# Patient Record
Sex: Female | Born: 1965
Health system: Southern US, Community
[De-identification: ages and names within clinical notes are randomized; demographics above are authoritative.]

## PROBLEM LIST (undated history)

## (undated) DIAGNOSIS — H905 Unspecified sensorineural hearing loss: Secondary | ICD-10-CM

## (undated) DIAGNOSIS — Z86718 Personal history of other venous thrombosis and embolism: Secondary | ICD-10-CM

## (undated) DIAGNOSIS — Z91030 Bee allergy status: Secondary | ICD-10-CM

## (undated) DIAGNOSIS — R002 Palpitations: Secondary | ICD-10-CM

## (undated) DIAGNOSIS — E559 Vitamin D deficiency, unspecified: Secondary | ICD-10-CM

## (undated) HISTORY — DX: Personal history of other venous thrombosis and embolism: Z86.718

## (undated) HISTORY — DX: Vitamin D deficiency, unspecified: E55.9

## (undated) HISTORY — DX: Unspecified sensorineural hearing loss: H90.5

## (undated) HISTORY — DX: Bee allergy status: Z91.030

## (undated) HISTORY — PX: TUBAL LIGATION: SHX77

## (undated) HISTORY — DX: Palpitations: R00.2

---

## 1999-06-20 ENCOUNTER — Other Ambulatory Visit: Admission: RE | Admit: 1999-06-20 | Discharge: 1999-06-20 | Payer: Self-pay | Admitting: Obstetrics and Gynecology

## 2000-07-02 ENCOUNTER — Other Ambulatory Visit: Admission: RE | Admit: 2000-07-02 | Discharge: 2000-07-02 | Payer: Self-pay | Admitting: Obstetrics and Gynecology

## 2001-07-06 ENCOUNTER — Other Ambulatory Visit: Admission: RE | Admit: 2001-07-06 | Discharge: 2001-07-06 | Payer: Self-pay | Admitting: Obstetrics and Gynecology

## 2002-01-04 ENCOUNTER — Ambulatory Visit: Admission: RE | Admit: 2002-01-04 | Discharge: 2002-01-04 | Payer: Self-pay | Admitting: Obstetrics and Gynecology

## 2002-03-16 ENCOUNTER — Ambulatory Visit (HOSPITAL_BASED_OUTPATIENT_CLINIC_OR_DEPARTMENT_OTHER): Admission: RE | Admit: 2002-03-16 | Discharge: 2002-03-16 | Payer: Self-pay | Admitting: Obstetrics and Gynecology

## 2002-07-29 ENCOUNTER — Ambulatory Visit (HOSPITAL_COMMUNITY): Admission: RE | Admit: 2002-07-29 | Discharge: 2002-07-29 | Payer: Self-pay | Admitting: *Deleted

## 2002-07-29 ENCOUNTER — Encounter: Payer: Self-pay | Admitting: *Deleted

## 2002-08-09 ENCOUNTER — Other Ambulatory Visit: Admission: RE | Admit: 2002-08-09 | Discharge: 2002-08-09 | Payer: Self-pay | Admitting: Obstetrics and Gynecology

## 2003-09-26 ENCOUNTER — Other Ambulatory Visit: Admission: RE | Admit: 2003-09-26 | Discharge: 2003-09-26 | Payer: Self-pay | Admitting: Obstetrics and Gynecology

## 2003-11-11 ENCOUNTER — Encounter: Admission: RE | Admit: 2003-11-11 | Discharge: 2003-11-11 | Payer: Self-pay | Admitting: Obstetrics and Gynecology

## 2003-11-22 ENCOUNTER — Ambulatory Visit (HOSPITAL_COMMUNITY): Admission: RE | Admit: 2003-11-22 | Discharge: 2003-11-22 | Payer: Self-pay | Admitting: Family Medicine

## 2004-02-23 ENCOUNTER — Ambulatory Visit: Payer: Self-pay | Admitting: Pulmonary Disease

## 2004-03-30 ENCOUNTER — Other Ambulatory Visit: Admission: RE | Admit: 2004-03-30 | Discharge: 2004-03-30 | Payer: Self-pay | Admitting: Obstetrics and Gynecology

## 2004-06-22 ENCOUNTER — Inpatient Hospital Stay (HOSPITAL_COMMUNITY): Admission: AD | Admit: 2004-06-22 | Discharge: 2004-06-23 | Payer: Self-pay | Admitting: *Deleted

## 2004-10-31 ENCOUNTER — Other Ambulatory Visit: Admission: RE | Admit: 2004-10-31 | Discharge: 2004-10-31 | Payer: Self-pay | Admitting: Obstetrics and Gynecology

## 2004-11-14 ENCOUNTER — Encounter: Admission: RE | Admit: 2004-11-14 | Discharge: 2004-11-14 | Payer: Self-pay | Admitting: Obstetrics and Gynecology

## 2009-11-16 ENCOUNTER — Encounter: Admission: RE | Admit: 2009-11-16 | Discharge: 2009-11-16 | Payer: Self-pay | Admitting: Obstetrics and Gynecology

## 2010-07-13 NOTE — H&P (Signed)
NAMEMarland Navarro  PLEASANT, BRITZ NO.:  [id number]   MEDICAL RECORD NO.:  [id number]          PATIENT TYPE:  INP   LOCATION:  5501                         FACILITY:  [facility]   PHYSICIAN:  Theone Stanley, MD   DATE OF BIRTH:  Jul 09, 1965   DATE OF ADMISSION:  06/22/2004  DATE OF DISCHARGE:                                HISTORY & PHYSICAL   CHIEF COMPLAINT:  Left leg pain.   HISTORY OF PRESENT ILLNESS:  Ms. Julie Navarro is a very pleasant 45 year old  Caucasian female presenting to the office secondary to left leg pain and  swelling since Tuesday. She describes the pain as a crampy type feeling  which originally was in her calf which now extends to her whole leg. She had  an appointment with Dr. Ann Lions on Monday because of pain in her foot. X-  rays were performed which showed a pin that appeared to be too long. However  the next day she started to have pain and she went to explain this to Dr.  Gaylan Gerold. Dr. Gaylan Gerold set up an appointment with [facility] to do a Doppler of her  lower extremities which showed left leg deep venous thrombosis extending  from the common femoral vein extending to the calf veins into the distal two-  thirds of the calf. This prompted Dr. Gaylan Gerold to admit the patient. The  patient states she has had no other similar problems. She has no family  history of blood clots and no blood dyscrasias. She did note that she had a  slight fever on Wednesday night.   PAST MEDICAL HISTORY:  Some question of hypoglycemia.   SURGERIES:  The patient had a left bunion surgery on April 25, 2004, and a  right bunion surgery on March 07, 2004.   MEDICATIONS:  She takes oral contraceptives (she thinks Nortrel 777).   ALLERGIES:  No known drug allergies. She is allergic to BEE STINGS.   FAMILY HISTORY:  Mother had lung cancer, she was a smoker. Father had mitral  valve prolapse and hyperlipidemia.   SOCIAL HISTORY:  The patient lives in [place], she is single. No  children. No  tobacco or illicit drug use. She occasionally drinks alcohol.   REVIEW OF SYSTEMS:  See HPI, otherwise all systems were negative.   PHYSICAL EXAMINATION:  VITAL SIGNS:  Temperature 98.6, pulse of 86,  respiratory rate of 20, blood pressure 115/74, saturating 95% on room air.  HEENT:  Head atraumatic, normocephalic. Extraocular movements are intact.  Ears are normal without discharge. Throat clear, no erythema or exudates.  NECK:  Supple, no lymphadenopathy, no jugular venous distension.  HEART:  Regular rate and rhythm, no murmurs, rubs, or gallops appreciated.  LUNGS:  Clear to auscultation bilaterally.  ABDOMEN:  Soft, nontender, nondistended.  GU:  Deferred.  EXTREMITIES:  It was evident that her left leg was slightly larger than her  right. In addition it felt somewhat warmer. She has minimal pain on  palpation of the calf and negative Homan's sign.  NEURO:  The patient is alert and oriented x3, 5 out of 5 strength in upper  and  lower extremities.   LABS:  Are pending.   ASSESSMENT/PLAN:  A 45 year old female with recent surgery in March, and on  oral contraceptives, presenting with an extensive deep venous thrombosis.  Will start Lovenox here in the hospital and start Coumadin tomorrow morning.  Give education on her Coumadin and instruct her on Lovenox injections. The  patient does have several risk factors for a deep venous thrombosis,  however, she is young and it has been quite some time since her original  surgery. Therefore will obtain a hypercoagulable panel to see if there are  any underlying issues. I instructed her that she can no longer take her oral  contraceptive pill and that she will need to see her gynecologist regarding  other medications that she can take for contraception.      AEJ/MEDQ  D:  06/22/2004  T:  06/23/2004  Job:  [id number]   cc:   Tinnie Gens A. Petrinitz, D.P.M.  [address] [place]  [place] [address]  Fax: [phone]

## 2010-09-26 ENCOUNTER — Other Ambulatory Visit: Payer: Self-pay | Admitting: Orthopaedic Surgery

## 2010-09-26 DIAGNOSIS — M25561 Pain in right knee: Secondary | ICD-10-CM

## 2010-09-28 ENCOUNTER — Ambulatory Visit
Admission: RE | Admit: 2010-09-28 | Discharge: 2010-09-28 | Disposition: A | Discharge status: Home / Self Care | Payer: BC Managed Care – PPO | Source: Ambulatory Visit | Attending: Orthopaedic Surgery | Admitting: Orthopaedic Surgery

## 2010-09-28 DIAGNOSIS — M25561 Pain in right knee: Secondary | ICD-10-CM

## 2011-04-23 ENCOUNTER — Other Ambulatory Visit: Payer: Self-pay | Admitting: Obstetrics and Gynecology

## 2011-04-23 DIAGNOSIS — Z1231 Encounter for screening mammogram for malignant neoplasm of breast: Secondary | ICD-10-CM

## 2011-04-29 ENCOUNTER — Ambulatory Visit
Admission: RE | Admit: 2011-04-29 | Discharge: 2011-04-29 | Disposition: A | Discharge status: Home / Self Care | Payer: BC Managed Care – PPO | Source: Ambulatory Visit | Attending: Obstetrics and Gynecology | Admitting: Obstetrics and Gynecology

## 2011-04-29 DIAGNOSIS — Z1231 Encounter for screening mammogram for malignant neoplasm of breast: Secondary | ICD-10-CM

## 2012-05-05 ENCOUNTER — Other Ambulatory Visit: Payer: Self-pay

## 2012-05-05 DIAGNOSIS — Z1231 Encounter for screening mammogram for malignant neoplasm of breast: Secondary | ICD-10-CM

## 2012-06-10 ENCOUNTER — Ambulatory Visit
Admission: RE | Admit: 2012-06-10 | Discharge: 2012-06-10 | Disposition: A | Discharge status: Home / Self Care | Payer: BC Managed Care – PPO | Source: Ambulatory Visit

## 2012-06-10 DIAGNOSIS — Z1231 Encounter for screening mammogram for malignant neoplasm of breast: Secondary | ICD-10-CM

## 2013-05-27 ENCOUNTER — Other Ambulatory Visit: Payer: Self-pay

## 2013-05-27 DIAGNOSIS — Z1231 Encounter for screening mammogram for malignant neoplasm of breast: Secondary | ICD-10-CM

## 2013-06-15 ENCOUNTER — Ambulatory Visit
Admission: RE | Admit: 2013-06-15 | Discharge: 2013-06-15 | Disposition: A | Discharge status: Home / Self Care | Payer: Self-pay | Source: Ambulatory Visit

## 2013-06-15 ENCOUNTER — Encounter (INDEPENDENT_AMBULATORY_CARE_PROVIDER_SITE_OTHER): Payer: Self-pay

## 2013-06-15 DIAGNOSIS — Z1231 Encounter for screening mammogram for malignant neoplasm of breast: Secondary | ICD-10-CM

## 2013-06-18 ENCOUNTER — Other Ambulatory Visit: Payer: Self-pay | Admitting: Obstetrics and Gynecology

## 2013-06-18 DIAGNOSIS — R928 Other abnormal and inconclusive findings on diagnostic imaging of breast: Secondary | ICD-10-CM

## 2013-06-30 ENCOUNTER — Ambulatory Visit
Admission: RE | Admit: 2013-06-30 | Discharge: 2013-06-30 | Disposition: A | Discharge status: Home / Self Care | Payer: BC Managed Care – PPO | Source: Ambulatory Visit | Attending: Obstetrics and Gynecology | Admitting: Obstetrics and Gynecology

## 2013-06-30 DIAGNOSIS — R928 Other abnormal and inconclusive findings on diagnostic imaging of breast: Secondary | ICD-10-CM

## 2013-11-29 ENCOUNTER — Other Ambulatory Visit: Payer: Self-pay | Admitting: Obstetrics and Gynecology

## 2013-11-29 DIAGNOSIS — N6489 Other specified disorders of breast: Secondary | ICD-10-CM

## 2013-12-31 ENCOUNTER — Other Ambulatory Visit: Payer: Self-pay | Admitting: Obstetrics and Gynecology

## 2013-12-31 ENCOUNTER — Ambulatory Visit
Admission: RE | Admit: 2013-12-31 | Discharge: 2013-12-31 | Disposition: A | Discharge status: Home / Self Care | Payer: BC Managed Care – PPO | Source: Ambulatory Visit | Attending: Obstetrics and Gynecology | Admitting: Obstetrics and Gynecology

## 2013-12-31 DIAGNOSIS — N6489 Other specified disorders of breast: Secondary | ICD-10-CM

## 2014-05-19 ENCOUNTER — Other Ambulatory Visit: Payer: Self-pay

## 2014-05-19 DIAGNOSIS — Z1231 Encounter for screening mammogram for malignant neoplasm of breast: Secondary | ICD-10-CM

## 2014-06-20 ENCOUNTER — Ambulatory Visit
Admission: RE | Admit: 2014-06-20 | Discharge: 2014-06-20 | Disposition: A | Discharge status: Home / Self Care | Payer: 59 | Source: Ambulatory Visit

## 2014-06-20 DIAGNOSIS — Z1231 Encounter for screening mammogram for malignant neoplasm of breast: Secondary | ICD-10-CM

## 2014-10-10 ENCOUNTER — Other Ambulatory Visit: Payer: Self-pay

## 2014-12-12 ENCOUNTER — Ambulatory Visit (INDEPENDENT_AMBULATORY_CARE_PROVIDER_SITE_OTHER): Payer: Self-pay | Admitting: *Deleted

## 2014-12-12 DIAGNOSIS — J309 Allergic rhinitis, unspecified: Secondary | ICD-10-CM

## 2014-12-12 MED ORDER — EPINEPHRINE 0.3 MG/0.3ML IJ SOAJ: 0.3000 mg | Freq: Once | INTRAMUSCULAR | Status: DC

## 2015-01-09 ENCOUNTER — Ambulatory Visit (INDEPENDENT_AMBULATORY_CARE_PROVIDER_SITE_OTHER): Payer: 59

## 2015-01-09 DIAGNOSIS — T63441D Toxic effect of venom of bees, accidental (unintentional), subsequent encounter: Secondary | ICD-10-CM | POA: Diagnosis not present

## 2015-02-06 ENCOUNTER — Ambulatory Visit (INDEPENDENT_AMBULATORY_CARE_PROVIDER_SITE_OTHER): Payer: 59

## 2015-02-06 DIAGNOSIS — T63441D Toxic effect of venom of bees, accidental (unintentional), subsequent encounter: Secondary | ICD-10-CM | POA: Diagnosis not present

## 2015-03-06 ENCOUNTER — Other Ambulatory Visit: Payer: Self-pay | Admitting: Family Medicine

## 2015-03-06 DIAGNOSIS — H9121 Sudden idiopathic hearing loss, right ear: Secondary | ICD-10-CM

## 2015-03-06 DIAGNOSIS — D330 Benign neoplasm of brain, supratentorial: Secondary | ICD-10-CM

## 2015-03-09 ENCOUNTER — Ambulatory Visit (INDEPENDENT_AMBULATORY_CARE_PROVIDER_SITE_OTHER): Payer: 59 | Admitting: *Deleted

## 2015-03-09 DIAGNOSIS — T63441D Toxic effect of venom of bees, accidental (unintentional), subsequent encounter: Secondary | ICD-10-CM | POA: Diagnosis not present

## 2015-03-14 ENCOUNTER — Ambulatory Visit
Admission: RE | Admit: 2015-03-14 | Discharge: 2015-03-14 | Disposition: A | Discharge status: Home / Self Care | Payer: 59 | Source: Ambulatory Visit | Attending: Family Medicine | Admitting: Family Medicine

## 2015-03-14 DIAGNOSIS — D330 Benign neoplasm of brain, supratentorial: Secondary | ICD-10-CM

## 2015-03-14 DIAGNOSIS — H9121 Sudden idiopathic hearing loss, right ear: Secondary | ICD-10-CM

## 2015-03-14 MED ORDER — GADOBENATE DIMEGLUMINE 529 MG/ML IV SOLN
13.0000 mL | Freq: Once | INTRAVENOUS | Status: AC | PRN
  Administered 2015-03-14: 13 mL via INTRAVENOUS

## 2015-03-20 ENCOUNTER — Telehealth: Payer: Self-pay | Admitting: Pediatrics

## 2015-03-20 NOTE — Telephone Encounter (Signed)
Has a question about bill received

## 2015-03-20 NOTE — Telephone Encounter (Signed)
LEFT MESSAGE THAT I WILL CALL BACK TUESDAY

## 2015-03-22 NOTE — Telephone Encounter (Signed)
PT CALLED ME BACK TODAY - QUESTIONS ABOUT WHY SHE IS GETTING 2 BILLS - SHE WILL PAY WHEN SHE COMES IN FOR INJ EARLY FEB

## 2015-04-06 ENCOUNTER — Ambulatory Visit (INDEPENDENT_AMBULATORY_CARE_PROVIDER_SITE_OTHER): Payer: 59 | Admitting: *Deleted

## 2015-04-06 DIAGNOSIS — T63441D Toxic effect of venom of bees, accidental (unintentional), subsequent encounter: Secondary | ICD-10-CM | POA: Diagnosis not present

## 2015-05-03 ENCOUNTER — Telehealth: Payer: Self-pay | Admitting: Pediatrics

## 2015-05-03 ENCOUNTER — Ambulatory Visit (INDEPENDENT_AMBULATORY_CARE_PROVIDER_SITE_OTHER): Payer: 59 | Admitting: *Deleted

## 2015-05-03 DIAGNOSIS — T63441D Toxic effect of venom of bees, accidental (unintentional), subsequent encounter: Secondary | ICD-10-CM | POA: Diagnosis not present

## 2015-05-03 NOTE — Telephone Encounter (Signed)
MAILED PAYMENTS FOR 2016

## 2015-05-03 NOTE — Telephone Encounter (Signed)
Patient request an itemized bill for all of 2016 to show the payments she has made so she can add them onto her taxes. I was able to print a bill from 02/25/2014 through 11/21/2014. Can you print the remainder of they year from [facility] and mail it to her please. Thanks!

## 2015-05-30 ENCOUNTER — Ambulatory Visit (INDEPENDENT_AMBULATORY_CARE_PROVIDER_SITE_OTHER): Payer: 59

## 2015-05-30 DIAGNOSIS — T63441D Toxic effect of venom of bees, accidental (unintentional), subsequent encounter: Secondary | ICD-10-CM

## 2015-06-05 ENCOUNTER — Other Ambulatory Visit: Payer: Self-pay

## 2015-06-05 DIAGNOSIS — Z1231 Encounter for screening mammogram for malignant neoplasm of breast: Secondary | ICD-10-CM

## 2015-06-21 ENCOUNTER — Ambulatory Visit
Admission: RE | Admit: 2015-06-21 | Discharge: 2015-06-21 | Disposition: A | Discharge status: Home / Self Care | Payer: 59 | Source: Ambulatory Visit

## 2015-06-21 DIAGNOSIS — Z1231 Encounter for screening mammogram for malignant neoplasm of breast: Secondary | ICD-10-CM

## 2015-06-27 ENCOUNTER — Ambulatory Visit (INDEPENDENT_AMBULATORY_CARE_PROVIDER_SITE_OTHER): Payer: 59

## 2015-06-27 DIAGNOSIS — T63441D Toxic effect of venom of bees, accidental (unintentional), subsequent encounter: Secondary | ICD-10-CM | POA: Diagnosis not present

## 2015-08-25 DIAGNOSIS — T63441D Toxic effect of venom of bees, accidental (unintentional), subsequent encounter: Secondary | ICD-10-CM | POA: Diagnosis not present

## 2015-08-30 DIAGNOSIS — T63441D Toxic effect of venom of bees, accidental (unintentional), subsequent encounter: Secondary | ICD-10-CM | POA: Diagnosis not present

## 2015-08-31 ENCOUNTER — Ambulatory Visit (INDEPENDENT_AMBULATORY_CARE_PROVIDER_SITE_OTHER): Payer: 59 | Admitting: *Deleted

## 2015-08-31 DIAGNOSIS — T63441D Toxic effect of venom of bees, accidental (unintentional), subsequent encounter: Secondary | ICD-10-CM

## 2015-10-10 DIAGNOSIS — T63441D Toxic effect of venom of bees, accidental (unintentional), subsequent encounter: Secondary | ICD-10-CM | POA: Diagnosis not present

## 2015-10-11 DIAGNOSIS — T63441D Toxic effect of venom of bees, accidental (unintentional), subsequent encounter: Secondary | ICD-10-CM | POA: Diagnosis not present

## 2015-10-12 ENCOUNTER — Ambulatory Visit (INDEPENDENT_AMBULATORY_CARE_PROVIDER_SITE_OTHER): Payer: 59 | Admitting: *Deleted

## 2015-10-12 DIAGNOSIS — T63441D Toxic effect of venom of bees, accidental (unintentional), subsequent encounter: Secondary | ICD-10-CM

## 2015-11-22 DIAGNOSIS — T63441D Toxic effect of venom of bees, accidental (unintentional), subsequent encounter: Secondary | ICD-10-CM | POA: Diagnosis not present

## 2015-11-23 ENCOUNTER — Ambulatory Visit (INDEPENDENT_AMBULATORY_CARE_PROVIDER_SITE_OTHER): Payer: 59

## 2015-11-23 DIAGNOSIS — T63441D Toxic effect of venom of bees, accidental (unintentional), subsequent encounter: Secondary | ICD-10-CM

## 2016-01-02 ENCOUNTER — Ambulatory Visit: Payer: 59

## 2016-01-02 ENCOUNTER — Ambulatory Visit: Payer: 59 | Admitting: Pediatrics

## 2016-01-02 ENCOUNTER — Encounter: Payer: Self-pay | Admitting: Pediatrics

## 2016-01-02 VITALS — BP 114/60 | HR 63 | Temp 98.1°F | Resp 16 | Ht 61.25 in | Wt 149.0 lb

## 2016-01-02 DIAGNOSIS — T63481D Toxic effect of venom of other arthropod, accidental (unintentional), subsequent encounter: Secondary | ICD-10-CM

## 2016-01-02 DIAGNOSIS — T63481A Toxic effect of venom of other arthropod, accidental (unintentional), initial encounter: Secondary | ICD-10-CM | POA: Insufficient documentation

## 2016-01-02 MED ORDER — EPINEPHRINE 0.3 MG/0.3ML IJ SOAJ: INTRAMUSCULAR | 2 refills | Status: DC

## 2016-01-02 NOTE — Patient Instructions (Addendum)
Continue on venom injections every 6 weeks until May 2019. We will then go to venom injections every 8 weeks. We will plan to retest you for venom allergy in 2021  If you have an insect sting take Benadryl 50 mg every 4 hours and if you have life-threatening symptoms inject with EpiPen 0.3

## 2016-01-02 NOTE — Progress Notes (Signed)
  [place] [address] Dept: [phone]  FOLLOW UP NOTE  Patient ID: Julie Navarro, female    DOB: May 04, 1965  Age: 50 y.o. MRN: [id number] Date of Office Visit: 01/02/2016  Assessment  Chief Complaint: Allergies (doing well on venom injections, yearly followup )  HPI Julie Navarro presents for follow-up of an allergy to insect venoms. Her initial reaction was in 1979 consisting oh anaphylaxis She is being treated with mixed vespid venom and wasp venom. She began to get her injections every 6 weeks in May of this year. She  not had any insect sting since her last visit in July 2014.  Current medications are Benadryl and EpiPen 0.3 mg if needed   Drug Allergies:  Allergies  Allergen Reactions  . Bee Venom     Physical Exam: BP 114/60   Pulse 63   Temp 98.1 F (36.7 C) (Oral)   Resp 16   Ht 5' 1.25" (1.556 m)   Wt 149 lb (67.6 kg)   SpO2 98%   BMI 27.92 kg/m    Physical Exam  Constitutional: She appears well-developed and well-nourished.  HENT:  Eyes normal. Ears normal. Nose normal. Pharynx normal.  Neck: Neck supple.  Cardiovascular:  S1 and S2 normal no murmurs  Pulmonary/Chest:  Clear to percussion and auscultation  Lymphadenopathy:    She has no cervical adenopathy.  Psychiatric: She has a normal mood and affect. Her behavior is normal. Judgment and thought content normal.  Vitals reviewed.   Diagnostics:  none  Assessment and Plan: 1. Allergic reaction to insect sting, accidental or unintentional, subsequent encounter     Meds ordered this encounter  Medications  . EPINEPHrine (EPIPEN 2-PAK) 0.3 mg/0.3 mL IJ SOAJ injection    Sig: Use as directed for severe allergic reactions    Dispense:  2 Device    Refill:  2    Dispense either mylan brand or mylan generic    Patient Instructions  Continue on venom injections every 6 weeks until May 2019. We will then go to venom injections every 8 weeks. We will plan to retest you  for venom allergy in 2021  If you have an insect sting take Benadryl 50 mg every 4 hours and if you have life-threatening symptoms inject with EpiPen 0.3   Return in about 1 year (around 01/01/2017).    Thank you for the opportunity to care for this patient.  Please do not hesitate to contact me with questions.  Penne Lash, M.D.  [facility] [address] [place], [place] [address] [phone]

## 2016-02-13 ENCOUNTER — Ambulatory Visit (INDEPENDENT_AMBULATORY_CARE_PROVIDER_SITE_OTHER): Payer: 59

## 2016-02-13 DIAGNOSIS — T63441D Toxic effect of venom of bees, accidental (unintentional), subsequent encounter: Secondary | ICD-10-CM | POA: Diagnosis not present

## 2016-02-26 DIAGNOSIS — I82402 Acute embolism and thrombosis of unspecified deep veins of left lower extremity: Secondary | ICD-10-CM | POA: Diagnosis not present

## 2016-02-26 DIAGNOSIS — H9191 Unspecified hearing loss, right ear: Secondary | ICD-10-CM | POA: Diagnosis not present

## 2016-02-26 DIAGNOSIS — I82412 Acute embolism and thrombosis of left femoral vein: Secondary | ICD-10-CM | POA: Diagnosis not present

## 2016-02-26 DIAGNOSIS — M7989 Other specified soft tissue disorders: Secondary | ICD-10-CM | POA: Diagnosis not present

## 2016-02-26 DIAGNOSIS — E559 Vitamin D deficiency, unspecified: Secondary | ICD-10-CM | POA: Diagnosis not present

## 2016-02-26 DIAGNOSIS — M79605 Pain in left leg: Secondary | ICD-10-CM | POA: Diagnosis not present

## 2016-02-27 DIAGNOSIS — I82412 Acute embolism and thrombosis of left femoral vein: Secondary | ICD-10-CM | POA: Diagnosis not present

## 2016-03-01 ENCOUNTER — Other Ambulatory Visit: Payer: Self-pay | Admitting: Family Medicine

## 2016-03-01 DIAGNOSIS — I82402 Acute embolism and thrombosis of unspecified deep veins of left lower extremity: Secondary | ICD-10-CM

## 2016-03-01 DIAGNOSIS — I82409 Acute embolism and thrombosis of unspecified deep veins of unspecified lower extremity: Secondary | ICD-10-CM | POA: Diagnosis not present

## 2016-03-04 ENCOUNTER — Other Ambulatory Visit: Payer: 59

## 2016-03-06 DIAGNOSIS — I82409 Acute embolism and thrombosis of unspecified deep veins of unspecified lower extremity: Secondary | ICD-10-CM | POA: Diagnosis not present

## 2016-03-08 DIAGNOSIS — I82409 Acute embolism and thrombosis of unspecified deep veins of unspecified lower extremity: Secondary | ICD-10-CM | POA: Diagnosis not present

## 2016-03-25 ENCOUNTER — Ambulatory Visit (INDEPENDENT_AMBULATORY_CARE_PROVIDER_SITE_OTHER): Payer: 59

## 2016-03-25 DIAGNOSIS — T63441D Toxic effect of venom of bees, accidental (unintentional), subsequent encounter: Secondary | ICD-10-CM

## 2016-03-26 ENCOUNTER — Ambulatory Visit: Payer: 59

## 2016-05-03 DIAGNOSIS — Z7901 Long term (current) use of anticoagulants: Secondary | ICD-10-CM | POA: Diagnosis not present

## 2016-05-03 DIAGNOSIS — I82A19 Acute embolism and thrombosis of unspecified axillary vein: Secondary | ICD-10-CM | POA: Diagnosis not present

## 2016-05-07 ENCOUNTER — Ambulatory Visit (INDEPENDENT_AMBULATORY_CARE_PROVIDER_SITE_OTHER): Payer: 59

## 2016-05-07 DIAGNOSIS — T63441D Toxic effect of venom of bees, accidental (unintentional), subsequent encounter: Secondary | ICD-10-CM | POA: Diagnosis not present

## 2016-05-15 DIAGNOSIS — I82409 Acute embolism and thrombosis of unspecified deep veins of unspecified lower extremity: Secondary | ICD-10-CM | POA: Diagnosis not present

## 2016-05-31 ENCOUNTER — Other Ambulatory Visit: Payer: Self-pay | Admitting: Obstetrics and Gynecology

## 2016-05-31 DIAGNOSIS — Z1231 Encounter for screening mammogram for malignant neoplasm of breast: Secondary | ICD-10-CM

## 2016-06-18 ENCOUNTER — Ambulatory Visit (INDEPENDENT_AMBULATORY_CARE_PROVIDER_SITE_OTHER): Payer: 59 | Admitting: *Deleted

## 2016-06-18 DIAGNOSIS — T63441D Toxic effect of venom of bees, accidental (unintentional), subsequent encounter: Secondary | ICD-10-CM

## 2016-06-24 ENCOUNTER — Ambulatory Visit
Admission: RE | Admit: 2016-06-24 | Discharge: 2016-06-24 | Disposition: A | Discharge status: Home / Self Care | Payer: 59 | Source: Ambulatory Visit | Attending: Obstetrics and Gynecology | Admitting: Obstetrics and Gynecology

## 2016-06-24 DIAGNOSIS — Z1231 Encounter for screening mammogram for malignant neoplasm of breast: Secondary | ICD-10-CM

## 2016-07-19 ENCOUNTER — Other Ambulatory Visit: Payer: Self-pay | Admitting: Pediatrics

## 2016-07-19 MED ORDER — EPINEPHRINE 0.3 MG/0.3ML IJ SOAJ
INTRAMUSCULAR | 1 refills | Status: DC
Start: 2016-07-19 — End: 2018-07-02

## 2016-07-19 NOTE — Telephone Encounter (Signed)
Spoke with pt. Advised her to switch pharmacy for epi-pen refill. Epipen sent to [facility] on [address].

## 2016-07-19 NOTE — Telephone Encounter (Signed)
patient need a refill on EPI_PEN called into [facility] on [address]

## 2016-07-30 ENCOUNTER — Ambulatory Visit: Payer: Self-pay

## 2016-07-30 DIAGNOSIS — D6861 Antiphospholipid syndrome: Secondary | ICD-10-CM | POA: Diagnosis not present

## 2016-07-30 DIAGNOSIS — Z7901 Long term (current) use of anticoagulants: Secondary | ICD-10-CM | POA: Diagnosis not present

## 2016-07-30 DIAGNOSIS — I8290 Acute embolism and thrombosis of unspecified vein: Secondary | ICD-10-CM | POA: Diagnosis not present

## 2016-07-30 DIAGNOSIS — I82A19 Acute embolism and thrombosis of unspecified axillary vein: Secondary | ICD-10-CM | POA: Diagnosis not present

## 2016-07-31 ENCOUNTER — Ambulatory Visit (INDEPENDENT_AMBULATORY_CARE_PROVIDER_SITE_OTHER): Payer: 59

## 2016-07-31 DIAGNOSIS — T63441D Toxic effect of venom of bees, accidental (unintentional), subsequent encounter: Secondary | ICD-10-CM | POA: Diagnosis not present

## 2016-09-10 ENCOUNTER — Ambulatory Visit (INDEPENDENT_AMBULATORY_CARE_PROVIDER_SITE_OTHER): Payer: 59

## 2016-09-10 DIAGNOSIS — T63441D Toxic effect of venom of bees, accidental (unintentional), subsequent encounter: Secondary | ICD-10-CM

## 2016-09-10 DIAGNOSIS — I8290 Acute embolism and thrombosis of unspecified vein: Secondary | ICD-10-CM | POA: Diagnosis not present

## 2016-09-11 ENCOUNTER — Ambulatory Visit: Payer: Self-pay

## 2016-10-22 ENCOUNTER — Ambulatory Visit (INDEPENDENT_AMBULATORY_CARE_PROVIDER_SITE_OTHER): Payer: 59

## 2016-10-22 DIAGNOSIS — T63441D Toxic effect of venom of bees, accidental (unintentional), subsequent encounter: Secondary | ICD-10-CM | POA: Diagnosis not present

## 2016-12-03 ENCOUNTER — Ambulatory Visit (INDEPENDENT_AMBULATORY_CARE_PROVIDER_SITE_OTHER): Payer: 59

## 2016-12-03 ENCOUNTER — Ambulatory Visit: Payer: Self-pay

## 2016-12-03 DIAGNOSIS — T63441D Toxic effect of venom of bees, accidental (unintentional), subsequent encounter: Secondary | ICD-10-CM

## 2016-12-11 DIAGNOSIS — Z01419 Encounter for gynecological examination (general) (routine) without abnormal findings: Secondary | ICD-10-CM | POA: Diagnosis not present

## 2017-01-20 ENCOUNTER — Ambulatory Visit: Payer: Self-pay

## 2017-01-21 ENCOUNTER — Ambulatory Visit: Payer: 59

## 2017-01-23 ENCOUNTER — Ambulatory Visit (INDEPENDENT_AMBULATORY_CARE_PROVIDER_SITE_OTHER): Payer: 59

## 2017-01-23 DIAGNOSIS — T63441D Toxic effect of venom of bees, accidental (unintentional), subsequent encounter: Secondary | ICD-10-CM | POA: Diagnosis not present

## 2017-01-29 DIAGNOSIS — N946 Dysmenorrhea, unspecified: Secondary | ICD-10-CM | POA: Diagnosis not present

## 2017-01-29 DIAGNOSIS — N924 Excessive bleeding in the premenopausal period: Secondary | ICD-10-CM | POA: Diagnosis not present

## 2017-01-29 DIAGNOSIS — R1909 Other intra-abdominal and pelvic swelling, mass and lump: Secondary | ICD-10-CM | POA: Diagnosis not present

## 2017-02-05 DIAGNOSIS — N920 Excessive and frequent menstruation with regular cycle: Secondary | ICD-10-CM | POA: Diagnosis not present

## 2017-02-11 DIAGNOSIS — N920 Excessive and frequent menstruation with regular cycle: Secondary | ICD-10-CM | POA: Diagnosis not present

## 2017-02-11 DIAGNOSIS — N840 Polyp of corpus uteri: Secondary | ICD-10-CM | POA: Diagnosis not present

## 2017-03-03 ENCOUNTER — Ambulatory Visit (INDEPENDENT_AMBULATORY_CARE_PROVIDER_SITE_OTHER): Payer: 59

## 2017-03-03 DIAGNOSIS — T63441D Toxic effect of venom of bees, accidental (unintentional), subsequent encounter: Secondary | ICD-10-CM

## 2017-03-06 ENCOUNTER — Ambulatory Visit: Payer: Self-pay

## 2017-04-14 ENCOUNTER — Ambulatory Visit (INDEPENDENT_AMBULATORY_CARE_PROVIDER_SITE_OTHER): Payer: 59

## 2017-04-14 DIAGNOSIS — T63441D Toxic effect of venom of bees, accidental (unintentional), subsequent encounter: Secondary | ICD-10-CM | POA: Diagnosis not present

## 2017-05-28 ENCOUNTER — Ambulatory Visit (INDEPENDENT_AMBULATORY_CARE_PROVIDER_SITE_OTHER): Payer: 59

## 2017-05-28 ENCOUNTER — Ambulatory Visit: Payer: Self-pay

## 2017-05-28 DIAGNOSIS — T63441D Toxic effect of venom of bees, accidental (unintentional), subsequent encounter: Secondary | ICD-10-CM | POA: Diagnosis not present

## 2017-07-08 ENCOUNTER — Ambulatory Visit: Payer: Self-pay

## 2017-07-10 ENCOUNTER — Ambulatory Visit (INDEPENDENT_AMBULATORY_CARE_PROVIDER_SITE_OTHER): Payer: 59

## 2017-07-10 DIAGNOSIS — T63441D Toxic effect of venom of bees, accidental (unintentional), subsequent encounter: Secondary | ICD-10-CM | POA: Diagnosis not present

## 2017-07-15 ENCOUNTER — Other Ambulatory Visit: Payer: Self-pay | Admitting: Obstetrics and Gynecology

## 2017-07-15 DIAGNOSIS — Z1231 Encounter for screening mammogram for malignant neoplasm of breast: Secondary | ICD-10-CM

## 2017-08-07 ENCOUNTER — Ambulatory Visit
Admission: RE | Admit: 2017-08-07 | Discharge: 2017-08-07 | Disposition: A | Discharge status: Home / Self Care | Payer: 59 | Source: Ambulatory Visit | Attending: Obstetrics and Gynecology | Admitting: Obstetrics and Gynecology

## 2017-08-07 DIAGNOSIS — Z1231 Encounter for screening mammogram for malignant neoplasm of breast: Secondary | ICD-10-CM

## 2017-08-12 DIAGNOSIS — N3001 Acute cystitis with hematuria: Secondary | ICD-10-CM | POA: Diagnosis not present

## 2017-08-12 DIAGNOSIS — R350 Frequency of micturition: Secondary | ICD-10-CM | POA: Diagnosis not present

## 2017-08-20 ENCOUNTER — Telehealth: Payer: Self-pay

## 2017-08-20 ENCOUNTER — Ambulatory Visit (INDEPENDENT_AMBULATORY_CARE_PROVIDER_SITE_OTHER): Payer: 59 | Admitting: *Deleted

## 2017-08-20 DIAGNOSIS — T63441D Toxic effect of venom of bees, accidental (unintentional), subsequent encounter: Secondary | ICD-10-CM

## 2017-08-20 NOTE — Telephone Encounter (Signed)
Pt would like to know when she should get tested again for the venom? She is on MV and Wasp. Please advise

## 2017-08-20 NOTE — Telephone Encounter (Signed)
Lm for pt to call us back  

## 2017-08-20 NOTE — Telephone Encounter (Signed)
Informed pt of what dr B stated. Pt also wanted to know when she could go q 8 weeks. I went back a year ago and there was a note that stated she could go q 8 weeks in may 2019. I rescheduled pts next injections to 8 weeks.

## 2017-08-20 NOTE — Telephone Encounter (Signed)
3 to 5 years after she has reached maintenance every 4 weeks

## 2017-08-21 ENCOUNTER — Ambulatory Visit: Payer: Self-pay

## 2017-08-25 DIAGNOSIS — N390 Urinary tract infection, site not specified: Secondary | ICD-10-CM | POA: Diagnosis not present

## 2017-08-25 DIAGNOSIS — A499 Bacterial infection, unspecified: Secondary | ICD-10-CM | POA: Diagnosis not present

## 2017-08-25 DIAGNOSIS — R300 Dysuria: Secondary | ICD-10-CM | POA: Diagnosis not present

## 2017-08-27 DIAGNOSIS — D2261 Melanocytic nevi of right upper limb, including shoulder: Secondary | ICD-10-CM | POA: Diagnosis not present

## 2017-08-27 DIAGNOSIS — D2262 Melanocytic nevi of left upper limb, including shoulder: Secondary | ICD-10-CM | POA: Diagnosis not present

## 2017-08-27 DIAGNOSIS — L814 Other melanin hyperpigmentation: Secondary | ICD-10-CM | POA: Diagnosis not present

## 2017-10-01 ENCOUNTER — Ambulatory Visit: Payer: Self-pay

## 2017-10-01 DIAGNOSIS — R300 Dysuria: Secondary | ICD-10-CM | POA: Diagnosis not present

## 2017-10-01 DIAGNOSIS — R82998 Other abnormal findings in urine: Secondary | ICD-10-CM | POA: Diagnosis not present

## 2017-10-01 DIAGNOSIS — N390 Urinary tract infection, site not specified: Secondary | ICD-10-CM | POA: Diagnosis not present

## 2017-10-06 DIAGNOSIS — N390 Urinary tract infection, site not specified: Secondary | ICD-10-CM | POA: Diagnosis not present

## 2017-10-15 ENCOUNTER — Ambulatory Visit (INDEPENDENT_AMBULATORY_CARE_PROVIDER_SITE_OTHER): Payer: 59 | Admitting: *Deleted

## 2017-10-15 DIAGNOSIS — T63441D Toxic effect of venom of bees, accidental (unintentional), subsequent encounter: Secondary | ICD-10-CM

## 2017-11-20 DIAGNOSIS — Z86718 Personal history of other venous thrombosis and embolism: Secondary | ICD-10-CM | POA: Diagnosis not present

## 2017-11-20 DIAGNOSIS — Z1211 Encounter for screening for malignant neoplasm of colon: Secondary | ICD-10-CM | POA: Diagnosis not present

## 2017-11-20 DIAGNOSIS — Z91030 Bee allergy status: Secondary | ICD-10-CM | POA: Diagnosis not present

## 2017-11-25 DIAGNOSIS — R319 Hematuria, unspecified: Secondary | ICD-10-CM | POA: Diagnosis not present

## 2017-12-09 ENCOUNTER — Ambulatory Visit (INDEPENDENT_AMBULATORY_CARE_PROVIDER_SITE_OTHER): Payer: 59

## 2017-12-09 DIAGNOSIS — T63441D Toxic effect of venom of bees, accidental (unintentional), subsequent encounter: Secondary | ICD-10-CM

## 2017-12-16 DIAGNOSIS — D125 Benign neoplasm of sigmoid colon: Secondary | ICD-10-CM | POA: Diagnosis not present

## 2017-12-16 DIAGNOSIS — D122 Benign neoplasm of ascending colon: Secondary | ICD-10-CM | POA: Diagnosis not present

## 2017-12-16 DIAGNOSIS — D124 Benign neoplasm of descending colon: Secondary | ICD-10-CM | POA: Diagnosis not present

## 2017-12-16 DIAGNOSIS — D126 Benign neoplasm of colon, unspecified: Secondary | ICD-10-CM | POA: Diagnosis not present

## 2017-12-16 DIAGNOSIS — K635 Polyp of colon: Secondary | ICD-10-CM | POA: Diagnosis not present

## 2017-12-16 DIAGNOSIS — Z86010 Personal history of colonic polyps: Secondary | ICD-10-CM | POA: Diagnosis not present

## 2017-12-16 DIAGNOSIS — Z800 Family history of malignant neoplasm of digestive organs: Secondary | ICD-10-CM | POA: Diagnosis not present

## 2017-12-16 DIAGNOSIS — Z1211 Encounter for screening for malignant neoplasm of colon: Secondary | ICD-10-CM | POA: Diagnosis not present

## 2017-12-23 DIAGNOSIS — Z01419 Encounter for gynecological examination (general) (routine) without abnormal findings: Secondary | ICD-10-CM | POA: Diagnosis not present

## 2017-12-23 DIAGNOSIS — Z6827 Body mass index (BMI) 27.0-27.9, adult: Secondary | ICD-10-CM | POA: Diagnosis not present

## 2018-01-02 ENCOUNTER — Emergency Department (HOSPITAL_COMMUNITY)
Admission: EM | Admit: 2018-01-02 | Discharge: 2018-01-02 | Disposition: A | Discharge status: Home / Self Care | Payer: 59 | Attending: Emergency Medicine | Admitting: Emergency Medicine

## 2018-01-02 ENCOUNTER — Encounter (HOSPITAL_COMMUNITY): Payer: Self-pay | Admitting: Student

## 2018-01-02 DIAGNOSIS — Z7901 Long term (current) use of anticoagulants: Secondary | ICD-10-CM | POA: Insufficient documentation

## 2018-01-02 DIAGNOSIS — M7989 Other specified soft tissue disorders: Secondary | ICD-10-CM | POA: Diagnosis not present

## 2018-01-02 DIAGNOSIS — Z79899 Other long term (current) drug therapy: Secondary | ICD-10-CM | POA: Diagnosis not present

## 2018-01-02 DIAGNOSIS — R2242 Localized swelling, mass and lump, left lower limb: Secondary | ICD-10-CM | POA: Diagnosis not present

## 2018-01-02 NOTE — ED Notes (Signed)
Patient ambulatory to bathroom with steady gait at this time 

## 2018-01-02 NOTE — Discharge Instructions (Addendum)
You were seen in the ER Today for a swollen area to your left foot. We are not absolutely sure what this area is, but think it may be a portion of a varicose vein or a ganglion cyst- please see attached hand outs. Try not to irritate the area. Please have this re-evaluated by your primary care provider within 1 week. Return to the ER should the area become red/warm/hot/painful, should you develop leg swelling or calf pain consistent with previous DVTs, or any other concerns that you may have.  Additionally please have your blood pressure rechecked at your follow up appointment with primary care as this was elevated in the ER today.  Vitals:   01/02/18 0820  BP: (!) 144/99  Pulse: 66  Resp: 16  SpO2: 100%

## 2018-01-02 NOTE — ED Provider Notes (Signed)
[facility]ERGENCY DEPARTMENT Provider Note   CSN: [id number] Arrival date & time: 01/02/18  3825     History   Chief Complaint Chief Complaint  Patient presents with  . Foot Swelling    HPI Julie Navarro is a 52 y.o. female with a hx of prior left lower extremity DVTs x 2- currently on Eliquis PRN w/ flights and long travel who presents to the ED with concern for area of swelling to the L foot which she noted upon waiting up this AM. She states she went to bed without noticing anything and woke up with small circular mass/area of swelling to the anterior L foot. She states it is not painful. There are no specific alleviating/aggravating factors. Did not intervene prior to arrival. She states the affected area is in the location of a previously damaged vein which concerned her especially with her hx of DVTs. She states this does not feel similar to prior DVTs. She was seen by hematology and was not found to have a specific clotting disorder. She denies injury or change in activity. Denies pain. Denies redness, warmth, tingling, numbness, or weakness. Denies chest pain or dyspnea.   HPI  History reviewed. No pertinent past medical history.  Patient Active Problem List   Diagnosis Date Noted  . Allergic reaction to insect sting 01/02/2016    Past Surgical History:  Procedure Laterality Date  . TUBAL LIGATION       OB History   None      Home Medications    Prior to Admission medications   Medication Sig Start Date End Date Taking? Authorizing Provider  EPINEPHrine (EPIPEN 2-PAK) 0.3 mg/0.3 mL IJ SOAJ injection Use as directed for severe allergic reactions 07/19/16   Charlies Silvers, MD  NONFORMULARY OR COMPOUNDED ITEM Venom injections    [provider]  Vitamin D, Ergocalciferol, (DRISDOL) 50000 units CAPS capsule Take 50,000 Units by mouth every 7 (seven) days.    [provider]    Family History History reviewed. No  pertinent family history.  Social History Social History   Tobacco Use  . Smoking status: Never Smoker  . Smokeless tobacco: Never Used  Substance Use Topics  . Alcohol use: No  . Drug use: No     Allergies   Bee venom   Review of Systems Review of Systems  Constitutional: Negative for chills and fever.  Respiratory: Negative for shortness of breath.   Cardiovascular: Negative for chest pain.  Musculoskeletal: Negative for arthralgias and myalgias.  Skin: Negative for color change and rash.       Positive for swollen mass to L foot.   Neurological: Negative for weakness and numbness.  All other systems reviewed and are negative.    Physical Exam Updated Vital Signs BP (!) 144/99   Pulse 66   Resp 16   Ht 5\' 1"  (1.549 m)   Wt 63.5 kg   SpO2 100%   BMI 26.45 kg/m  Temp: 98.7 F orally.   Physical Exam  Constitutional: She appears well-developed and well-nourished. No distress.  HENT:  Head: Normocephalic and atraumatic.  Eyes: Conjunctivae are normal. Right eye exhibits no discharge. Left eye exhibits no discharge.  Cardiovascular: Normal rate and regular rhythm.  Pulses:      Dorsalis pedis pulses are 2+ on the right side, and 2+ on the left side.       Posterior tibial pulses are 2+ on the right side, and 2+ on the left  side.  Pulmonary/Chest: Effort normal and breath sounds normal. No respiratory distress.  Musculoskeletal:  Lower extremities: there is an approximately 1cm mobile mass to the proximal aspect of the dorsum of the left foot. This occurs fairly centrally along a varicose vein. It is not tender to palpation. No significant overlying erythema/warmth. No palpable fluctuance/induration. No other obvious deformities. No edema noted to the lower extremities. Normal AROM to the knees, ankles, and all digits. Lower extremities are non tender with soft compartments.   Neurological: She is alert.  Clear speech. Sensation grossly intact to bilateral lower  extremities. 5/5 symmetric strength with plantar/dorsiflexion. Ambulatory with steady gait.   Psychiatric: She has a normal mood and affect. Her behavior is normal. Thought content normal.  Nursing note and vitals reviewed.   ED Treatments / Results  Labs (all labs ordered are listed, but only abnormal results are displayed) Labs Reviewed - No data to display  EKG None  Radiology No results found.  Procedures Procedures (including critical care time)  Medications Ordered in ED Medications - No data to display   Initial Impression / Assessment and Plan / ED Course  I have reviewed the triage vital signs and the nursing notes.  Pertinent labs & imaging results that were available during my care of the patient were reviewed by me and considered in my medical decision making (see chart for details).   Patient presents to the ED with concern for mass to the L foot. She is concerned for possible clot as she has a damaged vein in this area and has a hx of DVTs however this is not similar per her report. Exam does not appear consistent with DVT, no pitting edema, no tenderness, area of concern is very well localized to a 1 cm mobile mass that seems suspicious for a ganglion cyst vs. varicosity based on exam and location. There is no fluctuance/induration to raise concern for abscess/cellulitis. No fevers or overlying erythema/warmth to raise concern for infectious etiology in general. While I do not suspect DVT, offered venous duplex to patient for re-assurance which she declined after our discussion. Feel patient is safe for discharge home without need for additional emergent evaluation. I discussed impression of the area, need for PCP follow-up, and return precautions with the patient. Provided opportunity for questions, patient confirmed understanding and is in agreement with plan.   Findings and plan of care discussed with supervising physician Dr. Tomi Bamberger who evaluated patient and is is in  agreement.   Final Clinical Impressions(s) / ED Diagnoses   Final diagnoses:  Mass of left foot    ED Discharge Orders    None       Amaryllis Dyke, PA-C 01/02/18 0930    Dorie Rank, MD 01/03/18 [phone]

## 2018-01-02 NOTE — ED Notes (Signed)
Patient verbalizes understanding of discharge instructions. Opportunity for questioning and answers were provided. Armband removed by staff, pt discharged from ED.  

## 2018-01-02 NOTE — ED Triage Notes (Signed)
Pt here for evaluation of left foot for possible blood clot. Hx of same. Pt noticed swelling in foot this morning and came to ED. Denies pain in foot. Pt reports taking eloquis 5mg  during long periods of travel.

## 2018-01-02 NOTE — ED Notes (Signed)
ED Provider at bedside. 

## 2018-01-07 DIAGNOSIS — M799 Soft tissue disorder, unspecified: Secondary | ICD-10-CM | POA: Diagnosis not present

## 2018-01-28 DIAGNOSIS — M799 Soft tissue disorder, unspecified: Secondary | ICD-10-CM | POA: Diagnosis not present

## 2018-02-03 ENCOUNTER — Ambulatory Visit (INDEPENDENT_AMBULATORY_CARE_PROVIDER_SITE_OTHER): Payer: 59

## 2018-02-03 DIAGNOSIS — T63441D Toxic effect of venom of bees, accidental (unintentional), subsequent encounter: Secondary | ICD-10-CM

## 2018-03-31 ENCOUNTER — Ambulatory Visit: Payer: Self-pay

## 2018-04-02 ENCOUNTER — Ambulatory Visit: Payer: Self-pay

## 2018-04-13 ENCOUNTER — Ambulatory Visit (INDEPENDENT_AMBULATORY_CARE_PROVIDER_SITE_OTHER): Payer: 59

## 2018-04-13 DIAGNOSIS — T63441D Toxic effect of venom of bees, accidental (unintentional), subsequent encounter: Secondary | ICD-10-CM | POA: Diagnosis not present

## 2018-06-08 ENCOUNTER — Ambulatory Visit: Payer: Self-pay

## 2018-06-22 DIAGNOSIS — S92909A Unspecified fracture of unspecified foot, initial encounter for closed fracture: Secondary | ICD-10-CM

## 2018-06-22 HISTORY — DX: Unspecified fracture of unspecified foot, initial encounter for closed fracture: S92.909A

## 2018-06-26 ENCOUNTER — Ambulatory Visit
Admission: RE | Admit: 2018-06-26 | Discharge: 2018-06-26 | Disposition: A | Discharge status: Home / Self Care | Payer: 59 | Source: Ambulatory Visit | Attending: Family Medicine | Admitting: Family Medicine

## 2018-06-26 ENCOUNTER — Other Ambulatory Visit: Payer: Self-pay

## 2018-06-26 ENCOUNTER — Other Ambulatory Visit: Payer: Self-pay | Admitting: Family Medicine

## 2018-06-26 DIAGNOSIS — M79672 Pain in left foot: Secondary | ICD-10-CM

## 2018-07-02 ENCOUNTER — Other Ambulatory Visit: Payer: Self-pay | Admitting: *Deleted

## 2018-07-02 MED ORDER — EPINEPHRINE 0.3 MG/0.3ML IJ SOAJ: INTRAMUSCULAR | 0 refills | Status: DC

## 2018-07-02 NOTE — Telephone Encounter (Signed)
Refilled epipen- pt is scheduling ov

## 2018-07-06 ENCOUNTER — Encounter: Payer: Self-pay | Admitting: Family Medicine

## 2018-07-06 ENCOUNTER — Ambulatory Visit (INDEPENDENT_AMBULATORY_CARE_PROVIDER_SITE_OTHER): Payer: 59 | Admitting: Family Medicine

## 2018-07-06 ENCOUNTER — Other Ambulatory Visit: Payer: Self-pay

## 2018-07-06 DIAGNOSIS — T63441A Toxic effect of venom of bees, accidental (unintentional), initial encounter: Secondary | ICD-10-CM | POA: Insufficient documentation

## 2018-07-06 DIAGNOSIS — T63441D Toxic effect of venom of bees, accidental (unintentional), subsequent encounter: Secondary | ICD-10-CM

## 2018-07-06 MED ORDER — EPINEPHRINE 0.3 MG/0.3ML IJ SOAJ: 0.3000 mg | INTRAMUSCULAR | 2 refills | Status: DC | PRN

## 2018-07-06 NOTE — Progress Notes (Addendum)
RE: Julie Navarro MRN: [id number] DOB: [birth year] Date of Telemedicine Visit: 07/06/2018  Referring provider: Fanny Bien, MD Primary care provider: Fanny Bien, MD  Chief Complaint: Allergies   Telemedicine Follow Up Visit via Telephone: I connected with Julie Navarro for a follow up on 07/06/18 by telephone and verified that I am speaking with the correct person using two identifiers.   I discussed the limitations, risks, security and privacy concerns of performing an evaluation and management service by telephone and the availability of in person appointments. I also discussed with the patient that there may be a patient responsible charge related to this service. The patient expressed understanding and agreed to proceed.  Patient is at home  Provider is at the office.  Visit start time: 10:26 Visit end time: 10:47 Insurance consent/check in by: Jan Fireman Medical consent and medical assistant/nurse: Katherina Right  History of Present Illness: She is a 53 y.o. female, who is being followed for allergy to stinging insects. Her previous allergy office visit was on 01/02/2016 with Dr. Shaune Leeks. At today's visit, she reports that she has not been stung be any stinging insects and has access to an epinephrine device at all times, however, these have expired at this time. She reports that her venom immunotherapy is going well with her last injection on 04/13/2018. She reports she did not want to risk being infected with COVID19 and stayed at home which resulted in missing her injection in March. She reports that she will be ready to resume injections in June. Her current medications are listed in the chart.  Assessment and Plan: Toxic effect of venom of bees Continue to avoid stinging insects. In case of an allergic reaction, take Benadryl 50 mg every 4 hours, and if life-threatening symptoms occur, inject with AuviQ 0.3 mg. Your next injection is scheduled for 07/30/2018   Call the clinic if this treatment plan is not working well for you  Follow up in 1 year or sooner if needed  Return in about 1 year (around 07/06/2019), or if symptoms worsen or fail to improve.  Meds ordered this encounter  Medications  . EPINEPHrine (AUVI-Q) 0.3 mg/0.3 mL IJ SOAJ injection    Sig: Inject 0.3 mLs (0.3 mg total) into the muscle as needed for anaphylaxis.    Dispense:  2 Device    Refill:  2    Medication List:  Current Outpatient Medications  Medication Sig Dispense Refill  . apixaban (ELIQUIS) 5 MG TABS tablet Take one tablet (5mg ) 1-2 hrs prior to flights >6hrs.    . NONFORMULARY OR COMPOUNDED ITEM Venom injections    . EPINEPHrine (AUVI-Q) 0.3 mg/0.3 mL IJ SOAJ injection Inject 0.3 mLs (0.3 mg total) into the muscle as needed for anaphylaxis. 2 Device 2   No current facility-administered medications for this visit.    Allergies: Allergies  Allergen Reactions  . Bee Venom    I reviewed her past medical history, social history, family history, and environmental history and no significant changes have been reported from previous visit on 01/02/2016.  Objective: Physical Exam Not obtained as encounter was done via telephone.   Previous notes and tests were reviewed.  I discussed the assessment and treatment plan with the patient. The patient was provided an opportunity to ask questions and all were answered. The patient agreed with the plan and demonstrated an understanding of the instructions.   The patient was advised to call back or seek an in-person evaluation if the symptoms worsen  or if the condition fails to improve as anticipated.  I provided 21 minutes of non-face-to-face time during this encounter.  It was my pleasure to participate in [place] Hollars's care today. Please feel free to contact me with any questions or concerns.   Sincerely,  Gareth Morgan, FNP   I have provided oversight concerning Gareth Morgan' evaluation and treatment of this  patient's health issues addressed during today's encounter. I agree with the assessment and therapeutic plan as outlined in the note.   Thank you for the opportunity to care for this patient.  Please do not hesitate to contact me with questions.  Penne Lash, M.D.  [facility] [address] [place], [place] [address] [phone]

## 2018-07-06 NOTE — Patient Instructions (Addendum)
Toxic effect of venom of bees Continue to avoid stinging insects. In case of an allergic reaction, take Benadryl 50 mg every 4 hours, and if life-threatening symptoms occur, inject with AuviQ 0.3 mg. Your next injection is scheduled for 07/30/2018  Call the clinic if this treatment plan is not working well for you  Follow up in 1 year or sooner if needed

## 2018-07-09 ENCOUNTER — Ambulatory Visit: Payer: Self-pay

## 2018-07-30 ENCOUNTER — Other Ambulatory Visit: Payer: Self-pay

## 2018-07-30 ENCOUNTER — Ambulatory Visit: Payer: 59

## 2018-07-30 DIAGNOSIS — T63441D Toxic effect of venom of bees, accidental (unintentional), subsequent encounter: Secondary | ICD-10-CM

## 2018-08-03 ENCOUNTER — Telehealth: Payer: Self-pay

## 2018-08-03 NOTE — Telephone Encounter (Signed)
Pt called stating she had been stung by a bee today and wanted to know if she should use her epi-pen even though she isn't having a reaction. I told her not to use the epi-pen just watch the area and put hydrocortisone cream on it and she can take her antihistamine medication. I informed her to use the epi-pen if she starts to have any anaphylactic reaction. I also informed her to stay in touch with [place] throughout the day to keep [place] informed of her condition.

## 2018-08-04 ENCOUNTER — Ambulatory Visit: Payer: Self-pay

## 2018-08-10 ENCOUNTER — Other Ambulatory Visit: Payer: Self-pay

## 2018-08-10 ENCOUNTER — Ambulatory Visit (INDEPENDENT_AMBULATORY_CARE_PROVIDER_SITE_OTHER): Payer: 59

## 2018-08-10 DIAGNOSIS — T63441D Toxic effect of venom of bees, accidental (unintentional), subsequent encounter: Secondary | ICD-10-CM | POA: Diagnosis not present

## 2018-09-21 ENCOUNTER — Other Ambulatory Visit: Payer: Self-pay | Admitting: Obstetrics and Gynecology

## 2018-09-21 DIAGNOSIS — Z1231 Encounter for screening mammogram for malignant neoplasm of breast: Secondary | ICD-10-CM

## 2018-10-05 ENCOUNTER — Ambulatory Visit: Payer: Self-pay

## 2018-10-06 ENCOUNTER — Ambulatory Visit: Payer: Self-pay

## 2018-10-13 ENCOUNTER — Ambulatory Visit (INDEPENDENT_AMBULATORY_CARE_PROVIDER_SITE_OTHER): Payer: 59

## 2018-10-13 DIAGNOSIS — T63441D Toxic effect of venom of bees, accidental (unintentional), subsequent encounter: Secondary | ICD-10-CM

## 2018-11-05 ENCOUNTER — Ambulatory Visit
Admission: RE | Admit: 2018-11-05 | Discharge: 2018-11-05 | Disposition: A | Discharge status: Home / Self Care | Payer: 59 | Source: Ambulatory Visit | Attending: Obstetrics and Gynecology | Admitting: Obstetrics and Gynecology

## 2018-11-05 ENCOUNTER — Other Ambulatory Visit: Payer: Self-pay

## 2018-11-05 DIAGNOSIS — Z1231 Encounter for screening mammogram for malignant neoplasm of breast: Secondary | ICD-10-CM

## 2018-11-06 ENCOUNTER — Other Ambulatory Visit: Payer: Self-pay | Admitting: Obstetrics and Gynecology

## 2018-11-06 DIAGNOSIS — R928 Other abnormal and inconclusive findings on diagnostic imaging of breast: Secondary | ICD-10-CM

## 2018-11-10 ENCOUNTER — Other Ambulatory Visit: Payer: Self-pay

## 2018-11-10 ENCOUNTER — Ambulatory Visit: Payer: 59

## 2018-11-10 ENCOUNTER — Ambulatory Visit
Admission: RE | Admit: 2018-11-10 | Discharge: 2018-11-10 | Disposition: A | Discharge status: Home / Self Care | Payer: 59 | Source: Ambulatory Visit | Attending: Obstetrics and Gynecology | Admitting: Obstetrics and Gynecology

## 2018-11-10 DIAGNOSIS — R928 Other abnormal and inconclusive findings on diagnostic imaging of breast: Secondary | ICD-10-CM

## 2018-12-08 ENCOUNTER — Ambulatory Visit: Payer: Self-pay

## 2018-12-10 ENCOUNTER — Ambulatory Visit: Payer: Self-pay

## 2018-12-15 ENCOUNTER — Ambulatory Visit (INDEPENDENT_AMBULATORY_CARE_PROVIDER_SITE_OTHER): Payer: 59

## 2018-12-15 ENCOUNTER — Other Ambulatory Visit: Payer: Self-pay

## 2018-12-15 DIAGNOSIS — T63441D Toxic effect of venom of bees, accidental (unintentional), subsequent encounter: Secondary | ICD-10-CM | POA: Diagnosis not present

## 2019-02-09 ENCOUNTER — Ambulatory Visit: Payer: Self-pay

## 2019-02-15 ENCOUNTER — Ambulatory Visit (INDEPENDENT_AMBULATORY_CARE_PROVIDER_SITE_OTHER): Payer: 59

## 2019-02-15 DIAGNOSIS — T63441D Toxic effect of venom of bees, accidental (unintentional), subsequent encounter: Secondary | ICD-10-CM | POA: Diagnosis not present

## 2019-04-13 ENCOUNTER — Ambulatory Visit: Payer: Self-pay

## 2019-04-15 ENCOUNTER — Ambulatory Visit: Payer: Self-pay

## 2019-04-20 ENCOUNTER — Ambulatory Visit (INDEPENDENT_AMBULATORY_CARE_PROVIDER_SITE_OTHER): Payer: 59

## 2019-04-20 ENCOUNTER — Other Ambulatory Visit: Payer: Self-pay

## 2019-04-20 DIAGNOSIS — T63441D Toxic effect of venom of bees, accidental (unintentional), subsequent encounter: Secondary | ICD-10-CM | POA: Diagnosis not present

## 2019-06-08 ENCOUNTER — Ambulatory Visit: Payer: Self-pay

## 2019-06-15 ENCOUNTER — Other Ambulatory Visit: Payer: Self-pay

## 2019-06-15 ENCOUNTER — Ambulatory Visit (INDEPENDENT_AMBULATORY_CARE_PROVIDER_SITE_OTHER): Payer: 59 | Admitting: *Deleted

## 2019-06-15 DIAGNOSIS — T63441D Toxic effect of venom of bees, accidental (unintentional), subsequent encounter: Secondary | ICD-10-CM | POA: Diagnosis not present

## 2019-08-10 ENCOUNTER — Ambulatory Visit: Payer: Self-pay

## 2019-08-17 ENCOUNTER — Ambulatory Visit (INDEPENDENT_AMBULATORY_CARE_PROVIDER_SITE_OTHER): Payer: 59 | Admitting: *Deleted

## 2019-08-17 DIAGNOSIS — T63441D Toxic effect of venom of bees, accidental (unintentional), subsequent encounter: Secondary | ICD-10-CM

## 2019-10-12 ENCOUNTER — Ambulatory Visit: Payer: Self-pay

## 2019-10-13 ENCOUNTER — Other Ambulatory Visit: Payer: Self-pay | Admitting: Obstetrics and Gynecology

## 2019-10-13 ENCOUNTER — Ambulatory Visit (INDEPENDENT_AMBULATORY_CARE_PROVIDER_SITE_OTHER): Payer: 59 | Admitting: *Deleted

## 2019-10-13 DIAGNOSIS — T63441D Toxic effect of venom of bees, accidental (unintentional), subsequent encounter: Secondary | ICD-10-CM

## 2019-10-13 DIAGNOSIS — Z1231 Encounter for screening mammogram for malignant neoplasm of breast: Secondary | ICD-10-CM

## 2019-11-09 ENCOUNTER — Ambulatory Visit
Admission: RE | Admit: 2019-11-09 | Discharge: 2019-11-09 | Disposition: A | Discharge status: Home / Self Care | Payer: 59 | Source: Ambulatory Visit | Attending: Obstetrics and Gynecology | Admitting: Obstetrics and Gynecology

## 2019-11-09 ENCOUNTER — Other Ambulatory Visit: Payer: Self-pay

## 2019-11-09 DIAGNOSIS — Z1231 Encounter for screening mammogram for malignant neoplasm of breast: Secondary | ICD-10-CM

## 2019-12-07 ENCOUNTER — Ambulatory Visit (INDEPENDENT_AMBULATORY_CARE_PROVIDER_SITE_OTHER): Payer: 59

## 2019-12-07 ENCOUNTER — Other Ambulatory Visit: Payer: Self-pay

## 2019-12-07 DIAGNOSIS — T63441D Toxic effect of venom of bees, accidental (unintentional), subsequent encounter: Secondary | ICD-10-CM

## 2020-02-01 ENCOUNTER — Other Ambulatory Visit: Payer: Self-pay

## 2020-02-01 ENCOUNTER — Ambulatory Visit (INDEPENDENT_AMBULATORY_CARE_PROVIDER_SITE_OTHER): Payer: 59

## 2020-02-01 DIAGNOSIS — T63441D Toxic effect of venom of bees, accidental (unintentional), subsequent encounter: Secondary | ICD-10-CM

## 2020-03-28 ENCOUNTER — Ambulatory Visit (INDEPENDENT_AMBULATORY_CARE_PROVIDER_SITE_OTHER): Payer: 59

## 2020-03-28 ENCOUNTER — Other Ambulatory Visit: Payer: Self-pay

## 2020-03-28 DIAGNOSIS — T63441D Toxic effect of venom of bees, accidental (unintentional), subsequent encounter: Secondary | ICD-10-CM

## 2020-05-23 ENCOUNTER — Ambulatory Visit: Payer: Self-pay

## 2020-05-31 ENCOUNTER — Ambulatory Visit (INDEPENDENT_AMBULATORY_CARE_PROVIDER_SITE_OTHER): Payer: 59 | Admitting: *Deleted

## 2020-05-31 ENCOUNTER — Other Ambulatory Visit: Payer: Self-pay

## 2020-05-31 DIAGNOSIS — T63441D Toxic effect of venom of bees, accidental (unintentional), subsequent encounter: Secondary | ICD-10-CM

## 2020-06-12 IMAGING — MG DIGITAL SCREENING BILATERAL MAMMOGRAM WITH TOMO AND CAD
8 series · 9 of 24 positions shown · non-contrast
Comparison: Previous exam(s).

CLINICAL DATA: Screening.

EXAM:
DIGITAL SCREENING BILATERAL MAMMOGRAM WITH TOMO AND CAD

[L CC synth-2D]
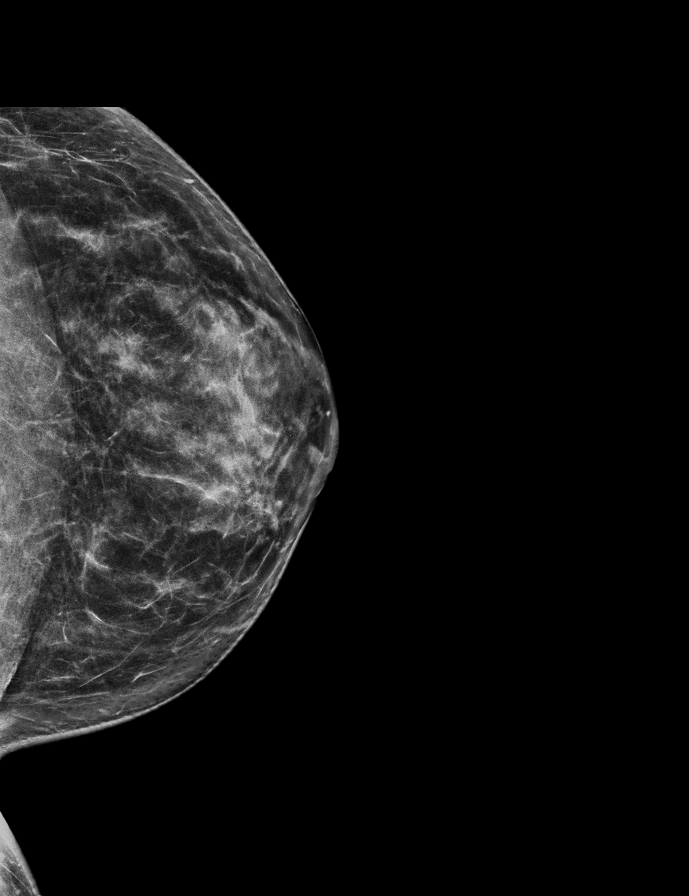

[R CC synth-2D]
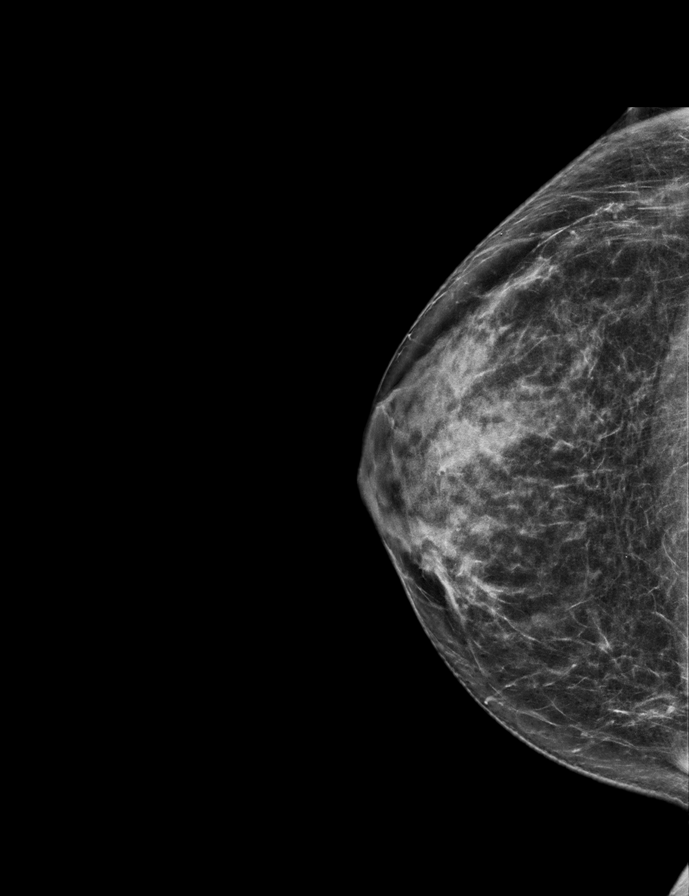

[R MLO synth-2D]
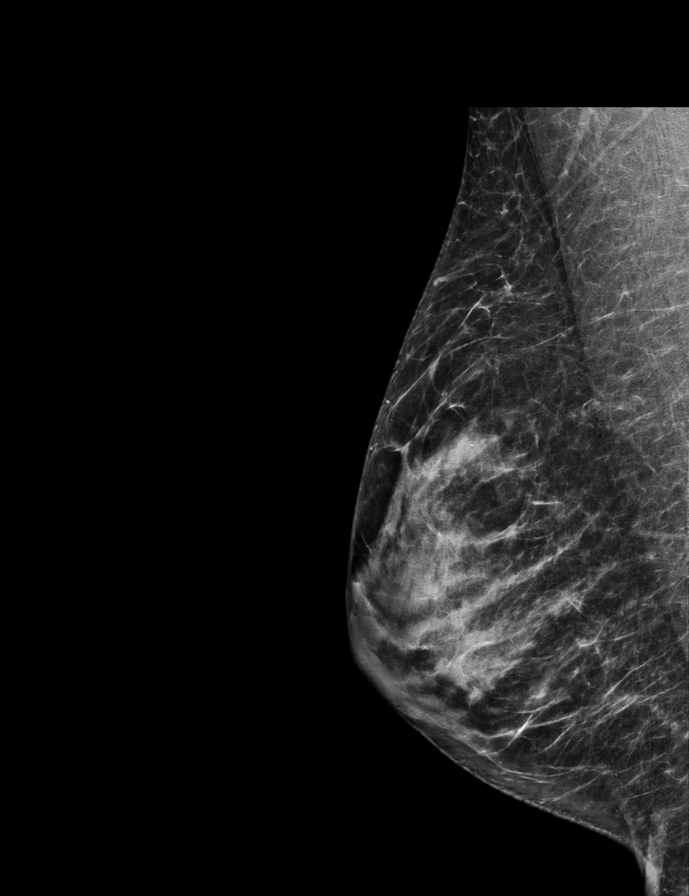

[L MLO synth-2D]
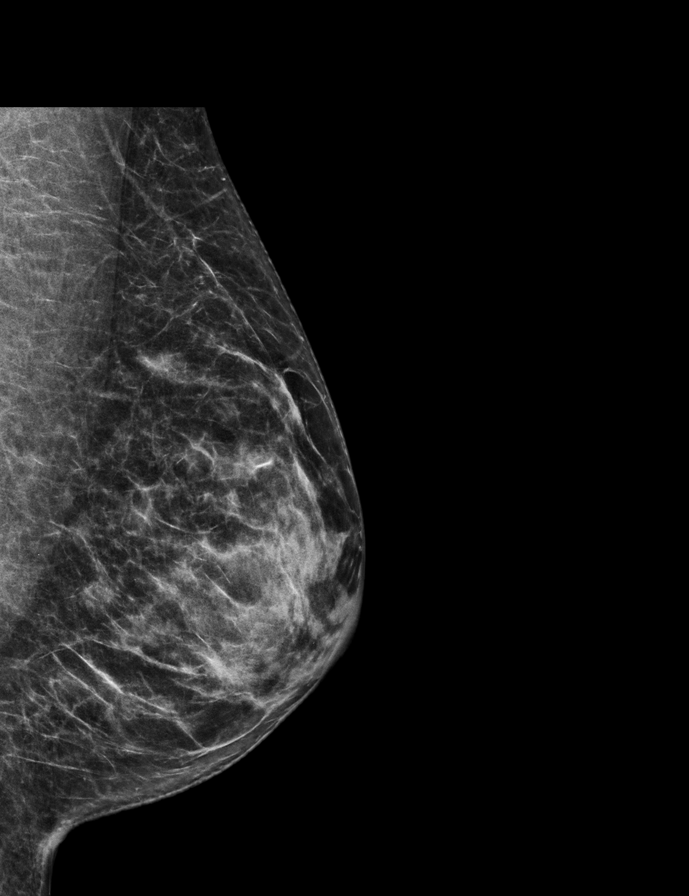

[R MLO tomo · 2 of 62 frames shown]
[frame 21/62]
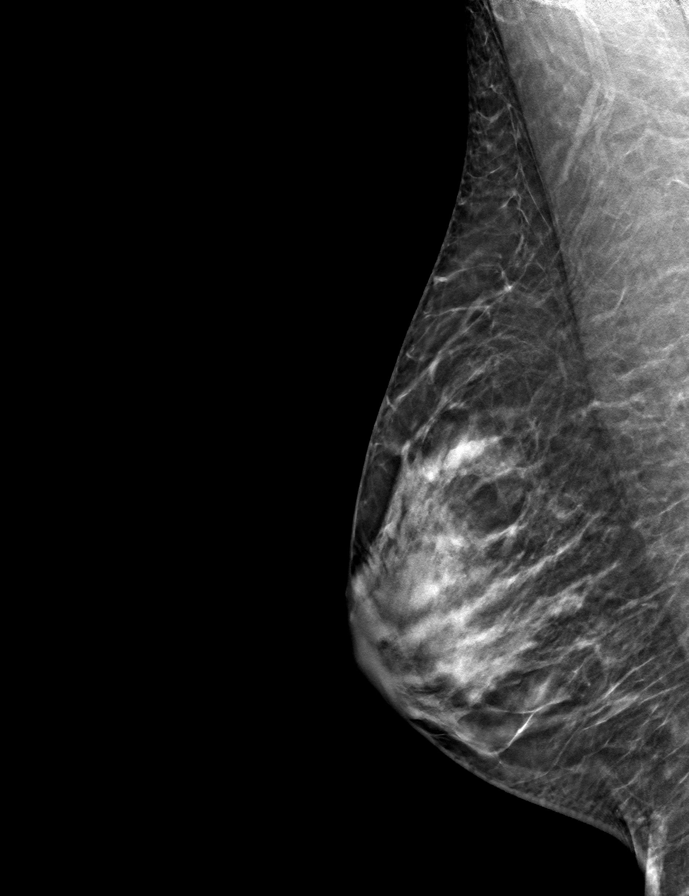
[frame 31/62]
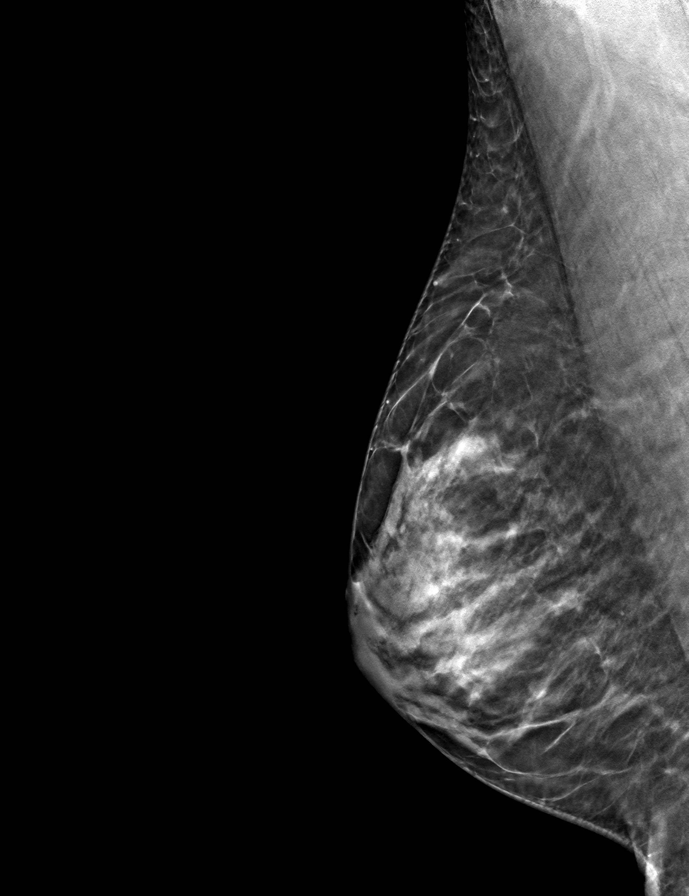

[L CC tomo · tomo slice 33/65.0]
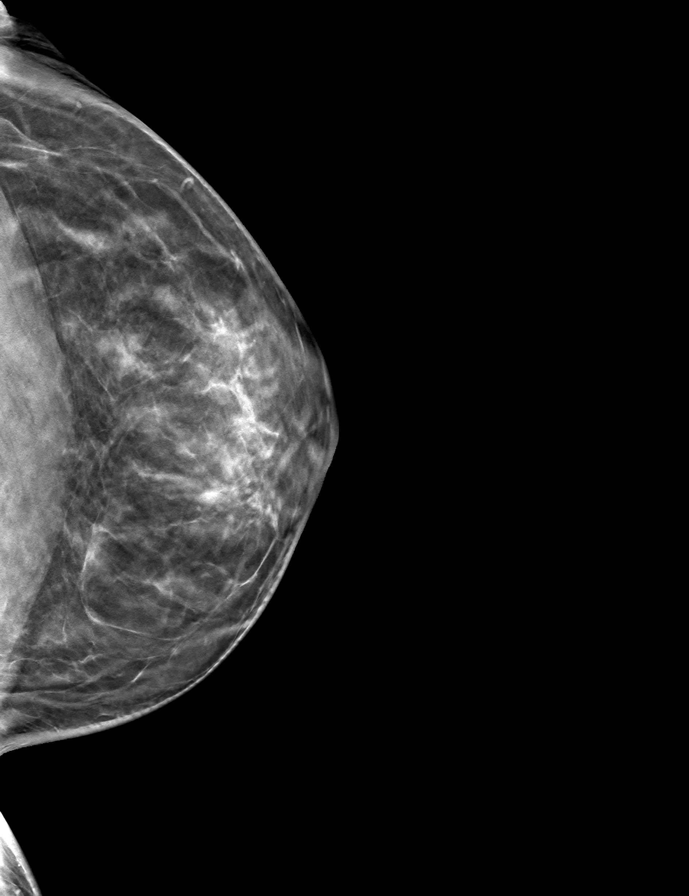

[R CC tomo · tomo slice 31/62.0]
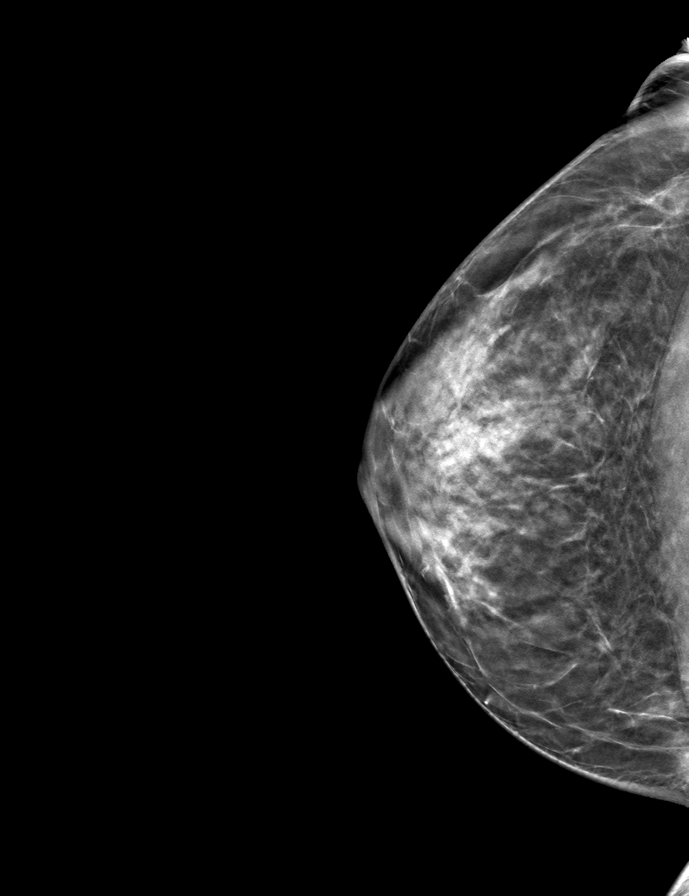

[L MLO tomo · tomo slice 29/58.0]
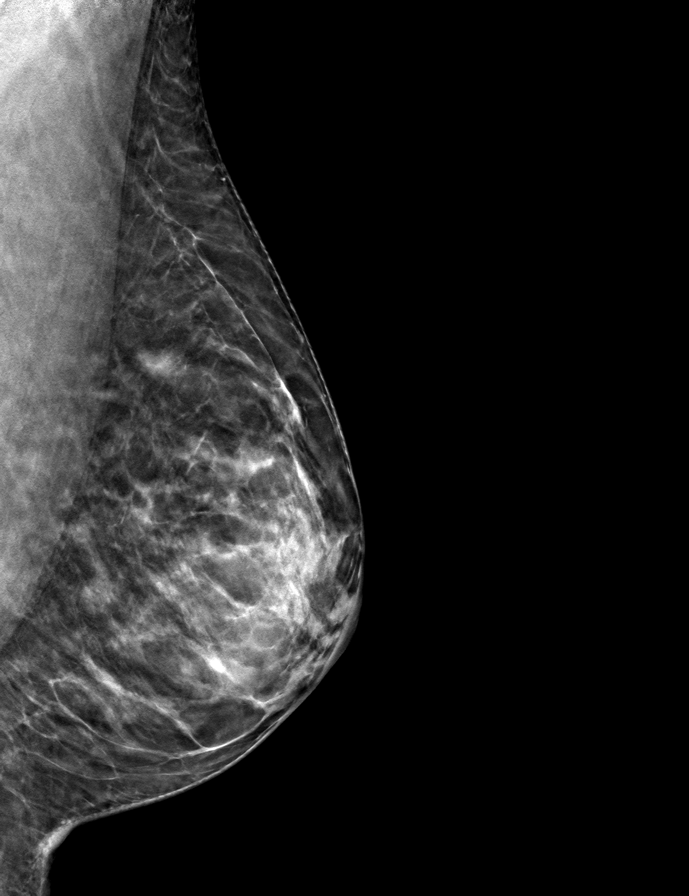

[9 of 24 positions shown; findings below may reference images not displayed]

ACR Breast Density Category c: The breast tissue is heterogeneously
dense, which may obscure small masses.
FINDINGS: There are no findings suspicious for malignancy. Images were
processed with CAD.
IMPRESSION: No mammographic evidence of malignancy. A result letter of this
screening mammogram will be mailed directly to the patient.

RECOMMENDATION:
Screening mammogram in one year. (Code:FT-U-LHB)

BI-RADS CATEGORY  1: Negative.

## 2020-08-01 ENCOUNTER — Ambulatory Visit: Payer: Self-pay

## 2020-08-09 ENCOUNTER — Ambulatory Visit (INDEPENDENT_AMBULATORY_CARE_PROVIDER_SITE_OTHER): Payer: 59

## 2020-08-09 ENCOUNTER — Other Ambulatory Visit: Payer: Self-pay

## 2020-08-09 DIAGNOSIS — T63441D Toxic effect of venom of bees, accidental (unintentional), subsequent encounter: Secondary | ICD-10-CM | POA: Diagnosis not present

## 2020-10-04 ENCOUNTER — Other Ambulatory Visit: Payer: Self-pay

## 2020-10-04 ENCOUNTER — Ambulatory Visit (INDEPENDENT_AMBULATORY_CARE_PROVIDER_SITE_OTHER): Payer: 59

## 2020-10-04 DIAGNOSIS — T63441D Toxic effect of venom of bees, accidental (unintentional), subsequent encounter: Secondary | ICD-10-CM | POA: Diagnosis not present

## 2020-10-26 NOTE — Patient Instructions (Signed)
Toxic effect of venom of bees Avoid stinging insects. In case of an allergic reaction, give Benadryl 4 teaspoonfuls every 4 hours, and if life-threatening symptoms occur, inject with AuviQ 0.3 mg. Your next injection is 11/29/2020  If you have problems again with your allergies consider scheduling an appointment for skin testing to environmental inhalants.  You would need to be off all antihistamines 3 days prior to this appointment.  Please let us know if this treatment plan is not working well for you. Schedule a follow up appointment in 1 year or sooner if needed

## 2020-10-27 ENCOUNTER — Ambulatory Visit (INDEPENDENT_AMBULATORY_CARE_PROVIDER_SITE_OTHER): Payer: 59 | Admitting: Family

## 2020-10-27 ENCOUNTER — Encounter: Payer: Self-pay | Admitting: Family

## 2020-10-27 ENCOUNTER — Other Ambulatory Visit: Payer: Self-pay

## 2020-10-27 VITALS — BP 106/64 | HR 65 | Temp 98.0°F | Ht 61.0 in | Wt 147.0 lb

## 2020-10-27 DIAGNOSIS — T63441D Toxic effect of venom of bees, accidental (unintentional), subsequent encounter: Secondary | ICD-10-CM

## 2020-10-27 MED ORDER — EPINEPHRINE 0.3 MG/0.3ML IJ SOAJ: 0.3000 mg | INTRAMUSCULAR | 2 refills | Status: DC | PRN

## 2020-10-27 NOTE — Progress Notes (Signed)
[address] [place] [address] Dept: [phone]  FOLLOW UP NOTE  Patient ID: Julie Navarro, female    DOB: Jun 29, 1965  Age: 55 y.o. MRN: [id number] Date of Office Visit: 10/27/2020  Assessment  Chief Complaint: Follow-up (Pt states that she have been doing well overall health is well managed.)  HPI Julie Navarro is a  55 year old female who presents today for follow-up of toxic effect of venom of bees.  She was last seen Jul 06, 2018 by Gareth Morgan, FNP.  Since her last office visit she reports that she was stung by honeybee while in [place] in May 2020.  She did not have a reaction to the sting.  She reports that her first initial reaction was in 1979 and involved anaphylaxis.  She continues to get her venom injections every 8 weeks.  She reports at times that the injection site is a little swollen, red, and itchy.  She also mentions that her Auvi-Q is out of date.  She mentions that this past June she was outside a lot and had a lot of sneezing and congestion.  She also mentions that in June she coughed for 3 weeks straight and did nine COVID-19 test that were negative.  She was treated for bronchitis and denies any coughing, wheezing, tightness in her chest ,or shortness of breath now.   Drug Allergies:  Allergies  Allergen Reactions   Bee Venom     Review of Systems: Review of Systems  Constitutional:  Negative for chills and fever.  HENT:         Denies rhinorrhea, nasal congestion, and postnasal drip.  She reports around June she was doing a lot of sneezing and was congested  Eyes:        Denies itchy watery eyes  Respiratory:  Negative for cough, shortness of breath and wheezing.        Reports that in June she coughed for about 3 weeks straight and had 9 negative COVID-19 test and was diagnosed with bronchitis.  Cardiovascular:  Negative for chest pain and palpitations.  Gastrointestinal:        Denies heartburn or reflux symptoms   Genitourinary:  Negative for frequency.  Skin:  Negative for itching and rash.  Neurological:  Negative for headaches.    Physical Exam: BP 106/64   Pulse 65   Temp 98 F (36.7 C) (Temporal)   Ht '5\' 1"'$  (1.549 m)   Wt 147 lb (66.7 kg)   SpO2 99%   BMI 27.78 kg/m    Physical Exam Constitutional:      Appearance: Normal appearance.  HENT:     Head: Normocephalic and atraumatic.     Comments: Pharynx normal, eyes normal, ears normal, nose normal    Right Ear: Tympanic membrane, ear canal and external ear normal.     Left Ear: Tympanic membrane, ear canal and external ear normal.     Nose: Nose normal.     Mouth/Throat:     Mouth: Mucous membranes are moist.     Pharynx: Oropharynx is clear.  Eyes:     Conjunctiva/sclera: Conjunctivae normal.  Cardiovascular:     Rate and Rhythm: Normal rate and regular rhythm.     Heart sounds: Normal heart sounds.  Pulmonary:     Effort: Pulmonary effort is normal.     Breath sounds: Normal breath sounds.     Comments: Lungs clear to auscultation Musculoskeletal:     Cervical back: Neck supple.  Skin:    General: Skin is warm.  Neurological:     Mental Status: She is alert and oriented to person, place, and time.  Psychiatric:        Mood and Affect: Mood normal.        Behavior: Behavior normal.        Thought Content: Thought content normal.        Judgment: Judgment normal.    Diagnostics: None  Assessment and Plan: 1. Toxic effect of venom of bees, unintentional, subsequent encounter     No orders of the defined types were placed in this encounter.   Patient Instructions  Toxic effect of venom of bees Avoid stinging insects. In case of an allergic reaction, give Benadryl 4 teaspoonfuls every 4 hours, and if life-threatening symptoms occur, inject with AuviQ 0.3 mg. Your next injection is 11/29/2020  If you have problems again with your allergies consider scheduling an appointment for skin testing to environmental  inhalants.  You would need to be off all antihistamines 3 days prior to this appointment.  Please let us know if this treatment plan is not working well for you. Schedule a follow up appointment in 1 year or sooner if needed   Return in about 1 year (around 10/27/2021), or if symptoms worsen or fail to improve.    Thank you for the opportunity to care for this patient.  Please do not hesitate to contact me with questions.  Althea Charon, FNP [facility]

## 2020-11-28 ENCOUNTER — Ambulatory Visit: Payer: Self-pay

## 2020-12-11 ENCOUNTER — Other Ambulatory Visit: Payer: Self-pay | Admitting: Obstetrics and Gynecology

## 2020-12-11 DIAGNOSIS — Z1231 Encounter for screening mammogram for malignant neoplasm of breast: Secondary | ICD-10-CM

## 2020-12-12 ENCOUNTER — Ambulatory Visit (INDEPENDENT_AMBULATORY_CARE_PROVIDER_SITE_OTHER): Payer: 59

## 2020-12-12 ENCOUNTER — Other Ambulatory Visit: Payer: Self-pay

## 2020-12-12 DIAGNOSIS — T63441D Toxic effect of venom of bees, accidental (unintentional), subsequent encounter: Secondary | ICD-10-CM | POA: Diagnosis not present

## 2021-01-09 ENCOUNTER — Ambulatory Visit
Admission: RE | Admit: 2021-01-09 | Discharge: 2021-01-09 | Disposition: A | Discharge status: Home / Self Care | Payer: 59 | Source: Ambulatory Visit | Attending: Obstetrics and Gynecology | Admitting: Obstetrics and Gynecology

## 2021-01-09 ENCOUNTER — Other Ambulatory Visit: Payer: Self-pay

## 2021-01-09 DIAGNOSIS — Z1231 Encounter for screening mammogram for malignant neoplasm of breast: Secondary | ICD-10-CM

## 2021-02-06 ENCOUNTER — Other Ambulatory Visit: Payer: Self-pay

## 2021-02-06 ENCOUNTER — Ambulatory Visit (INDEPENDENT_AMBULATORY_CARE_PROVIDER_SITE_OTHER): Payer: 59

## 2021-02-06 DIAGNOSIS — T63441D Toxic effect of venom of bees, accidental (unintentional), subsequent encounter: Secondary | ICD-10-CM

## 2021-03-30 ENCOUNTER — Ambulatory Visit: Payer: 59 | Admitting: Internal Medicine

## 2021-04-03 ENCOUNTER — Ambulatory Visit (INDEPENDENT_AMBULATORY_CARE_PROVIDER_SITE_OTHER): Payer: 59

## 2021-04-03 ENCOUNTER — Other Ambulatory Visit: Payer: Self-pay

## 2021-04-03 DIAGNOSIS — T63441D Toxic effect of venom of bees, accidental (unintentional), subsequent encounter: Secondary | ICD-10-CM | POA: Diagnosis not present

## 2021-04-04 NOTE — Progress Notes (Signed)
Cardiology Office Note:    Date:  04/05/2021   ID:  Julie Navarro, DOB [birth year], MRN [id number]  PCP:  Julie Pepper, MD   [facility] Providers Cardiologist:  Julie Furbish, MD     Referring MD: Julie Pepper, MD   History of Present Illness:    Julie Navarro is a 56 y.o. female here for the evaluation of palpitations at the request of Dr. Sela Navarro.  Notes from 03/09/2021 visit with Dr. Lindell Navarro reviewed. [name] initially had heart palpitations 20 years prior. Her EKG was fine at a time when she wasn't having palpitations. She had decreased caffeine and her palpitations resolved. A month prior to her appointment her palpitations recurred, but resolved with coughing. Over the last few weeks her palpitations had not been resolving with cough. While flying she had an episode of palpitations with an odd sensation on her shoulders, it was constant. EKG at her 03/09/21 visit showed no ectopy or palpitations. She was referred to cardiology for consideration of monitor.  Today: Overall, she appears well. About 2 months ago her palpitations began occurring more frequently, often constant. She reports her current palpitations are a little different. Sometimes her palpitations seem to be positional. They may worsen when sitting in the morning while drinking coffee, or leaning over a table. They are less noticeable while lying down. They no longer are relieved with coughing.  She recently stopped drinking caffeinated drinks and noticed some improvement in her palpitations (She had reintroduced caffeine 10 years ago). However, they still persist. She presents EKG strips recorded by her smart watch that seem to indicate PVCs (HR was 65).  Of note, she previously had 2 events of a thrombus in her LLE. Currently on Eliquis, she remains compliant every 12 hours.  She denies any chest pain, or shortness of breath. No lightheadedness, headaches, syncope, orthopnea, PND, lower  extremity edema or exertional symptoms.  Past Medical History:  Diagnosis Date   Bee allergy status    Foot fracture 06/22/2018   LEFT    Heart palpitations    History of DVT (deep vein thrombosis)    Sensorineural hearing loss    Vitamin D deficiency     Past Surgical History:  Procedure Laterality Date   TUBAL LIGATION      Current Medications: Current Meds  Medication Sig   apixaban (ELIQUIS) 5 MG TABS tablet Take 5 mg by mouth 2 (two) times daily.   EPINEPHrine (AUVI-Q) 0.3 mg/0.3 mL IJ SOAJ injection Inject 0.3 mg into the muscle as needed for anaphylaxis.   estradiol (VIVELLE-DOT) 0.0375 MG/24HR Place 1 patch onto the skin 2 (two) times a week.   NONFORMULARY OR COMPOUNDED ITEM Venom injections   progesterone (PROMETRIUM) 200 MG capsule Take 200 mg by mouth at bedtime.   Vitamin D, Ergocalciferol, (DRISDOL) 1.25 MG (50000 UNIT) CAPS capsule Take 50,000 Units by mouth every 7 (seven) days.     Allergies:   Bee venom   Social History   Socioeconomic History   Marital status: Single    Spouse name: Not on file   Number of children: Not on file   Years of education: Not on file   Highest education level: Not on file  Occupational History   Not on file  Tobacco Use   Smoking status: Never   Smokeless tobacco: Never  Substance and Sexual Activity   Alcohol use: No   Drug use: No   Sexual activity: Not on file  Other Topics Concern  Not on file  Social History Narrative   Not on file   Social Determinants of Health   Financial Resource Strain: Not on file  Food Insecurity: Not on file  Transportation Needs: Not on file  Physical Activity: Not on file  Stress: Not on file  Social Connections: Not on file     Family History: The patient's family history is not on file.  ROS:   Please see the history of present illness.    (+) Palpitations (+) Cough All other systems reviewed and are negative.  EKGs/Labs/Other Studies Reviewed:    The following  studies were reviewed today:  No prior cardiovascular studies available.   EKG:  EKG is personally reviewed and interpreted. 04/05/2021: EKG was not ordered. 03/09/2021 (Dr. Lindell Navarro): No ectopy or palpitations found, reviewed with patient at time of visit.  Recent Labs: No results found for requested labs within last 8760 hours.   Recent Lipid Panel No results found for: CHOL, TRIG, HDL, CHOLHDL, VLDL, LDLCALC, LDLDIRECT   Risk Assessment/Calculations:          Physical Exam:    VS:  BP 110/70 (BP Location: Left Arm, Patient Position: Sitting, Cuff Size: Normal)    Pulse 80    Ht 5\' 1"  (1.549 m)    Wt 150 lb (68 kg)    BMI 28.34 kg/m     Wt Readings from Last 3 Encounters:  04/05/21 150 lb (68 kg)  10/27/20 147 lb (66.7 kg)  01/02/18 140 lb (63.5 kg)     GEN: Well nourished, well developed in no acute distress HEENT: Normal NECK: No JVD; No carotid bruits LYMPHATICS: No lymphadenopathy CARDIAC: RRR, no murmurs, rubs, gallops RESPIRATORY:  Clear to auscultation without rales, wheezing or rhonchi  ABDOMEN: Soft, non-tender, non-distended MUSCULOSKELETAL:  No edema; No deformity  SKIN: Warm and dry NEUROLOGIC:  Alert and oriented x 3 PSYCHIATRIC:  Normal affect   ASSESSMENT:    1. Palpitations   2. Recurrent deep vein thrombosis (DVT) (HCC)    PLAN:    In order of problems listed above:  Recurrent deep vein thrombosis (DVT) (HCC) Continue with Eliquis 5 mg twice a day.  No changes made.  Has had recurrent DVTs.  Has seen hematology at [facility].  Palpitations By description, sounds like PVCs or PACs.  She does have an old tracing from a Galaxy watch with EKG attachment and it did look like premature ventricular contractions and a trigeminy or quadrigeminy pattern.  Continue with conservative management such as caffeine reduction or stimulant reduction.  We will check a Zio patch monitor to ensure that there is no evidence of other adverse  arrhythmias.  If absolutely necessary we can always utilize a beta-blocker or calcium channel blocker to help reduce burden.  Normally, these can be treated conservatively.       Follow-up: PRN.  Medication Adjustments/Labs and Tests Ordered: Current medicines are reviewed at length with the patient today.  Concerns regarding medicines are outlined above.   Orders Placed This Encounter  Procedures   LONG TERM MONITOR (3-14 DAYS)   No orders of the defined types were placed in this encounter.  Patient Instructions  Medication Instructions:  Your physician recommends that you continue on your current medications as directed. Please refer to the Current Medication list given to you today.  *If you need a refill on your cardiac medications before your next appointment, please call your pharmacy*   Lab Work: None today If you have labs (  blood work) drawn today and your tests are completely normal, you will receive your results only by: [place] (if you have MyChart) OR A paper copy in the mail If you have any lab test that is abnormal or we need to change your treatment, we will call you to review the results.   Testing/Procedures: A zio monitor was ordered today. It will remain on for 14 days. You will then return monitor and event diary in provided box. It takes 1-2 weeks for report to be downloaded and returned to [place]. We will call you with the results. If monitor falls off or has orange flashing light, please call Zio for further instructions.   ZIO XT- Long Term Monitor Instructions  Your physician has requested you wear a ZIO patch monitor for 14 days.  This is a single patch monitor. [employer] supplies one patch monitor per enrollment. Additional stickers are not available. Please do not apply patch if you will be having a Nuclear Stress Test,  Echocardiogram, Cardiac CT, MRI, or Chest Xray during the period you would be wearing the  monitor. The patch cannot be worn  during these tests. You cannot remove and re-apply the  ZIO XT patch monitor.  Your ZIO patch monitor will be mailed 3 day USPS to your address on file. It may take 3-5 days  to receive your monitor after you have been enrolled.  Once you have received your monitor, please review the enclosed instructions. Your monitor  has already been registered assigning a specific monitor serial # to you.  Billing and Patient Assistance Program Information  We have supplied [employer] with any of your insurance information on file for billing purposes. [employer] offers a sliding scale Patient Assistance Program for patients that do not have  insurance, or whose insurance does not completely cover the cost of the ZIO monitor.  You must apply for the Patient Assistance Program to qualify for this discounted rate.  To apply, please call [employer] at [phone], select option 4, select option 2, ask to apply for  Patient Assistance Program. Theodore Demark will ask your household income, and how many people  are in your household. They will quote your out-of-pocket cost based on that information.  [employer] will also be able to set up a 31-month, interest-free payment plan if needed.  Applying the monitor   Shave hair from upper left chest.  Hold abrader disc by orange tab. Rub abrader in 40 strokes over the upper left chest as  indicated in your monitor instructions.  Clean area with 4 enclosed alcohol pads. Let dry.  Apply patch as indicated in monitor instructions. Patch will be placed under collarbone on left  side of chest with arrow pointing upward.  Rub patch adhesive wings for 2 minutes. Remove white label marked "1". Remove the white  label marked "2". Rub patch adhesive wings for 2 additional minutes.  While looking in a mirror, press and release button in center of patch. A small green light will  flash 3-4 times. This will be your only indicator that the monitor has been turned on.  Do not shower for the  first 24 hours. You may shower after the first 24 hours.  Press the button if you feel a symptom. You will hear a small click. Record Date, Time and  Symptom in the Patient Logbook.  When you are ready to remove the patch, follow instructions on the last 2 pages of Patient  Logbook. Stick patch monitor onto the last  page of Patient Logbook.  Place Patient Logbook in the blue and white box. Use locking tab on box and tape box closed  securely. The blue and white box has prepaid postage on it. Please place it in the mailbox as  soon as possible. Your physician should have your test results approximately 7 days after the  monitor has been mailed back to [facility]  Call [place] at [phone] if you have questions regarding  your ZIO XT patch monitor. Call them immediately if you see an orange light blinking on your  monitor.  If your monitor falls off in less than 4 days, contact our Monitor department at [phone].  If your monitor becomes loose or falls off after 4 days call [employer] at [phone] for  suggestions on securing your monitor  Follow-Up: At [facility], you and your health needs are our priority.  As part of our continuing mission to provide you with exceptional heart care, we have created designated Provider Care Teams.  These Care Teams include your primary Cardiologist (physician) and Advanced Practice Providers (APPs -  Physician Assistants and Nurse Practitioners) who all work together to provide you with the care you need, when you need it.  We recommend signing up for the patient portal called "MyChart".  Sign up information is provided on this After Visit Summary.  MyChart is used to connect with patients for Virtual Visits (Telemedicine).  Patients are able to view lab/test results, encounter notes, upcoming appointments, etc.  Non-urgent messages can be sent to your provider as well.   To learn more about what you can do with MyChart, go  to [email or web address].    Your next appointment:   As needed.   Provider:   Candee Furbish, MD        [facility] [place],acting as a scribe for Julie Furbish, MD.,have documented all relevant documentation on the behalf of Julie Furbish, MD,as directed by  Julie Furbish, MD while in the presence of Julie Furbish, MD.  I, Julie Furbish, MD, have reviewed all documentation for this visit. The documentation on 04/05/21 for the exam, diagnosis, procedures, and orders are all accurate and complete.   Signed, Julie Furbish, MD  04/05/2021 5:25 PM    [facility]

## 2021-04-05 ENCOUNTER — Ambulatory Visit (INDEPENDENT_AMBULATORY_CARE_PROVIDER_SITE_OTHER): Payer: 59

## 2021-04-05 ENCOUNTER — Other Ambulatory Visit: Payer: Self-pay

## 2021-04-05 ENCOUNTER — Ambulatory Visit (INDEPENDENT_AMBULATORY_CARE_PROVIDER_SITE_OTHER): Payer: 59 | Admitting: Cardiology

## 2021-04-05 ENCOUNTER — Encounter: Payer: Self-pay | Admitting: Cardiology

## 2021-04-05 VITALS — BP 110/70 | HR 80 | Ht 61.0 in | Wt 150.0 lb

## 2021-04-05 DIAGNOSIS — I82409 Acute embolism and thrombosis of unspecified deep veins of unspecified lower extremity: Secondary | ICD-10-CM | POA: Diagnosis not present

## 2021-04-05 DIAGNOSIS — R002 Palpitations: Secondary | ICD-10-CM

## 2021-04-05 NOTE — Patient Instructions (Addendum)
Medication Instructions:  Your physician recommends that you continue on your current medications as directed. Please refer to the Current Medication list given to you today.  *If you need a refill on your cardiac medications before your next appointment, please call your pharmacy*   Lab Work: None today If you have labs (blood work) drawn today and your tests are completely normal, you will receive your results only by: [place] (if you have MyChart) OR A paper copy in the mail If you have any lab test that is abnormal or we need to change your treatment, we will call you to review the results.   Testing/Procedures: A zio monitor was ordered today. It will remain on for 14 days. You will then return monitor and event diary in provided box. It takes 1-2 weeks for report to be downloaded and returned to [place]. We will call you with the results. If monitor falls off or has orange flashing light, please call Zio for further instructions.   ZIO XT- Long Term Monitor Instructions  Your physician has requested you wear a ZIO patch monitor for 14 days.  This is a single patch monitor. [employer] supplies one patch monitor per enrollment. Additional stickers are not available. Please do not apply patch if you will be having a Nuclear Stress Test,  Echocardiogram, Cardiac CT, MRI, or Chest Xray during the period you would be wearing the  monitor. The patch cannot be worn during these tests. You cannot remove and re-apply the  ZIO XT patch monitor.  Your ZIO patch monitor will be mailed 3 day USPS to your address on file. It may take 3-5 days  to receive your monitor after you have been enrolled.  Once you have received your monitor, please review the enclosed instructions. Your monitor  has already been registered assigning a specific monitor serial # to you.  Billing and Patient Assistance Program Information  We have supplied [employer] with any of your insurance information on file for  billing purposes. [employer] offers a sliding scale Patient Assistance Program for patients that do not have  insurance, or whose insurance does not completely cover the cost of the ZIO monitor.  You must apply for the Patient Assistance Program to qualify for this discounted rate.  To apply, please call [employer] at [phone], select option 4, select option 2, ask to apply for  Patient Assistance Program. Theodore Demark will ask your household income, and how many people  are in your household. They will quote your out-of-pocket cost based on that information.  [employer] will also be able to set up a 27-month, interest-free payment plan if needed.  Applying the monitor   Shave hair from upper left chest.  Hold abrader disc by orange tab. Rub abrader in 40 strokes over the upper left chest as  indicated in your monitor instructions.  Clean area with 4 enclosed alcohol pads. Let dry.  Apply patch as indicated in monitor instructions. Patch will be placed under collarbone on left  side of chest with arrow pointing upward.  Rub patch adhesive wings for 2 minutes. Remove white label marked "1". Remove the white  label marked "2". Rub patch adhesive wings for 2 additional minutes.  While looking in a mirror, press and release button in center of patch. A small green light will  flash 3-4 times. This will be your only indicator that the monitor has been turned on.  Do not shower for the first 24 hours. You may shower after the first  24 hours.  Press the button if you feel a symptom. You will hear a small click. Record Date, Time and  Symptom in the Patient Logbook.  When you are ready to remove the patch, follow instructions on the last 2 pages of Patient  Logbook. Stick patch monitor onto the last page of Patient Logbook.  Place Patient Logbook in the blue and white box. Use locking tab on box and tape box closed  securely. The blue and white box has prepaid postage on it. Please place it in the mailbox  as  soon as possible. Your physician should have your test results approximately 7 days after the  monitor has been mailed back to [facility].  Call [place] at [phone] if you have questions regarding  your ZIO XT patch monitor. Call them immediately if you see an orange light blinking on your  monitor.  If your monitor falls off in less than 4 days, contact our Monitor department at [phone].  If your monitor becomes loose or falls off after 4 days call [employer] at [phone] for  suggestions on securing your monitor  Follow-Up: At [facility], you and your health needs are our priority.  As part of our continuing mission to provide you with exceptional heart care, we have created designated Provider Care Teams.  These Care Teams include your primary Cardiologist (physician) and Advanced Practice Providers (APPs -  Physician Assistants and Nurse Practitioners) who all work together to provide you with the care you need, when you need it.  We recommend signing up for the patient portal called "MyChart".  Sign up information is provided on this After Visit Summary.  MyChart is used to connect with patients for Virtual Visits (Telemedicine).  Patients are able to view lab/test results, encounter notes, upcoming appointments, etc.  Non-urgent messages can be sent to your provider as well.   To learn more about what you can do with MyChart, go to [email or web address].    Your next appointment:   As needed.   Provider:   Candee Furbish, MD

## 2021-04-05 NOTE — Progress Notes (Unsigned)
Enrolled for [employer] to mail a ZIO XT long term holter monitor to the patients address on file.  

## 2021-04-05 NOTE — Assessment & Plan Note (Signed)
Continue with Eliquis 5 mg twice a day.  No changes made.  Has had recurrent DVTs.  Has seen hematology at [facility].

## 2021-04-05 NOTE — Assessment & Plan Note (Signed)
By description, sounds like PVCs or PACs.  She does have an old tracing from a Galaxy watch with EKG attachment and it did look like premature ventricular contractions and a trigeminy or quadrigeminy pattern.  Continue with conservative management such as caffeine reduction or stimulant reduction.  We will check a Zio patch monitor to ensure that there is no evidence of other adverse arrhythmias.  If absolutely necessary we can always utilize a beta-blocker or calcium channel blocker to help reduce burden.  Normally, these can be treated conservatively.

## 2021-04-09 DIAGNOSIS — R002 Palpitations: Secondary | ICD-10-CM

## 2021-05-29 ENCOUNTER — Ambulatory Visit (INDEPENDENT_AMBULATORY_CARE_PROVIDER_SITE_OTHER): Payer: 59

## 2021-05-29 DIAGNOSIS — T63441D Toxic effect of venom of bees, accidental (unintentional), subsequent encounter: Secondary | ICD-10-CM

## 2021-07-24 ENCOUNTER — Ambulatory Visit: Payer: 59

## 2021-07-26 ENCOUNTER — Ambulatory Visit: Payer: 59

## 2021-07-27 ENCOUNTER — Ambulatory Visit (INDEPENDENT_AMBULATORY_CARE_PROVIDER_SITE_OTHER): Payer: 59

## 2021-07-27 DIAGNOSIS — T63441D Toxic effect of venom of bees, accidental (unintentional), subsequent encounter: Secondary | ICD-10-CM

## 2021-09-15 IMAGING — MG MM DIGITAL DIAGNOSTIC UNILAT*L* W/ TOMO W/ CAD
4 series · 4 of 12 positions shown · non-contrast
Comparison: Previous exam(s).

CLINICAL DATA: The patient was called back for a medial left breast asymmetry.

EXAM:
DIGITAL DIAGNOSTIC UNILATERAL LEFT MAMMOGRAM WITH CAD AND TOMO

[L CC synth-2D]
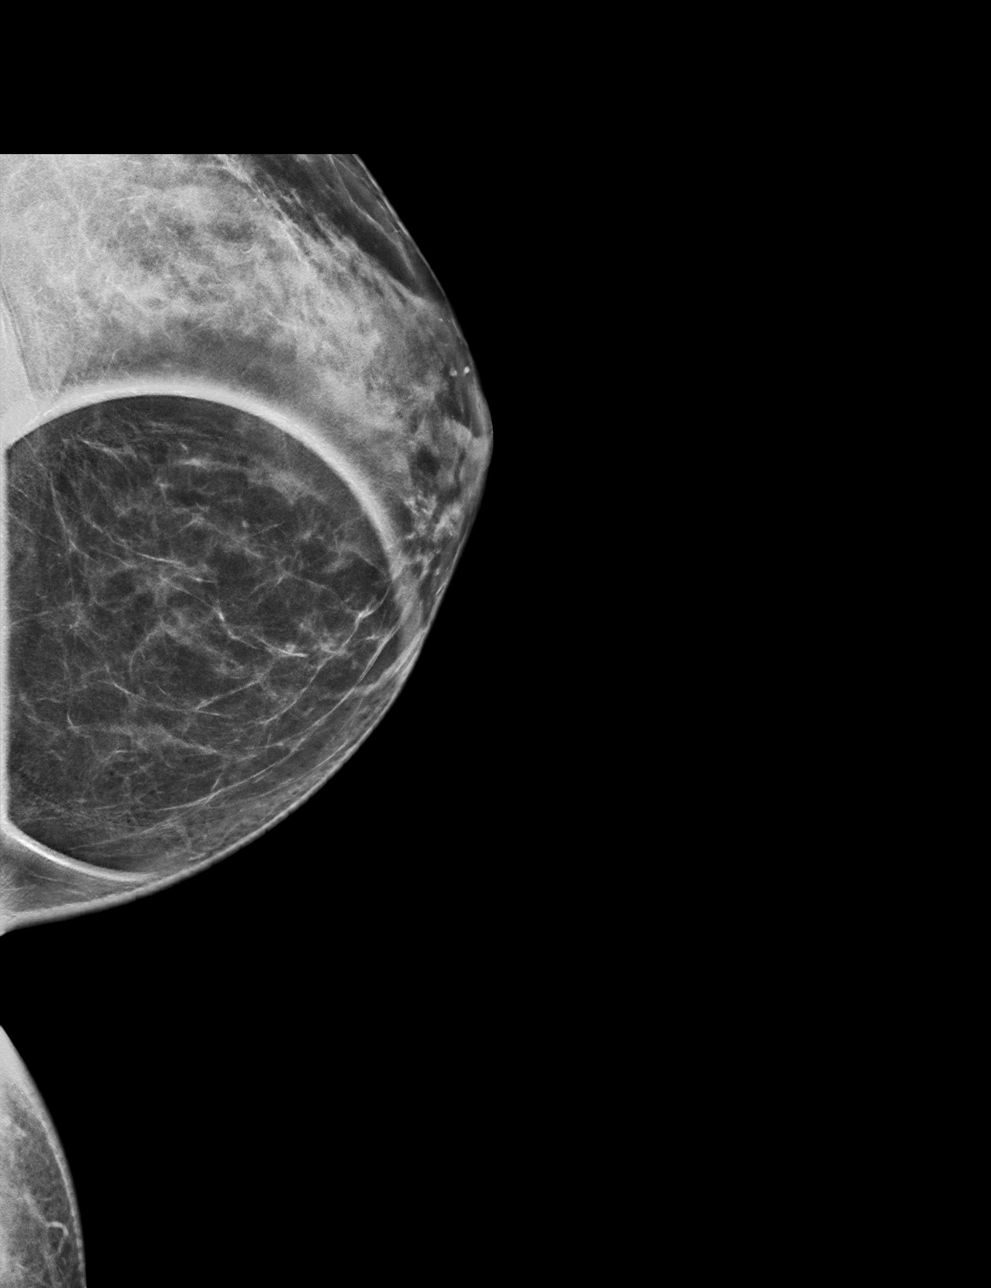

[L ML synth-2D]
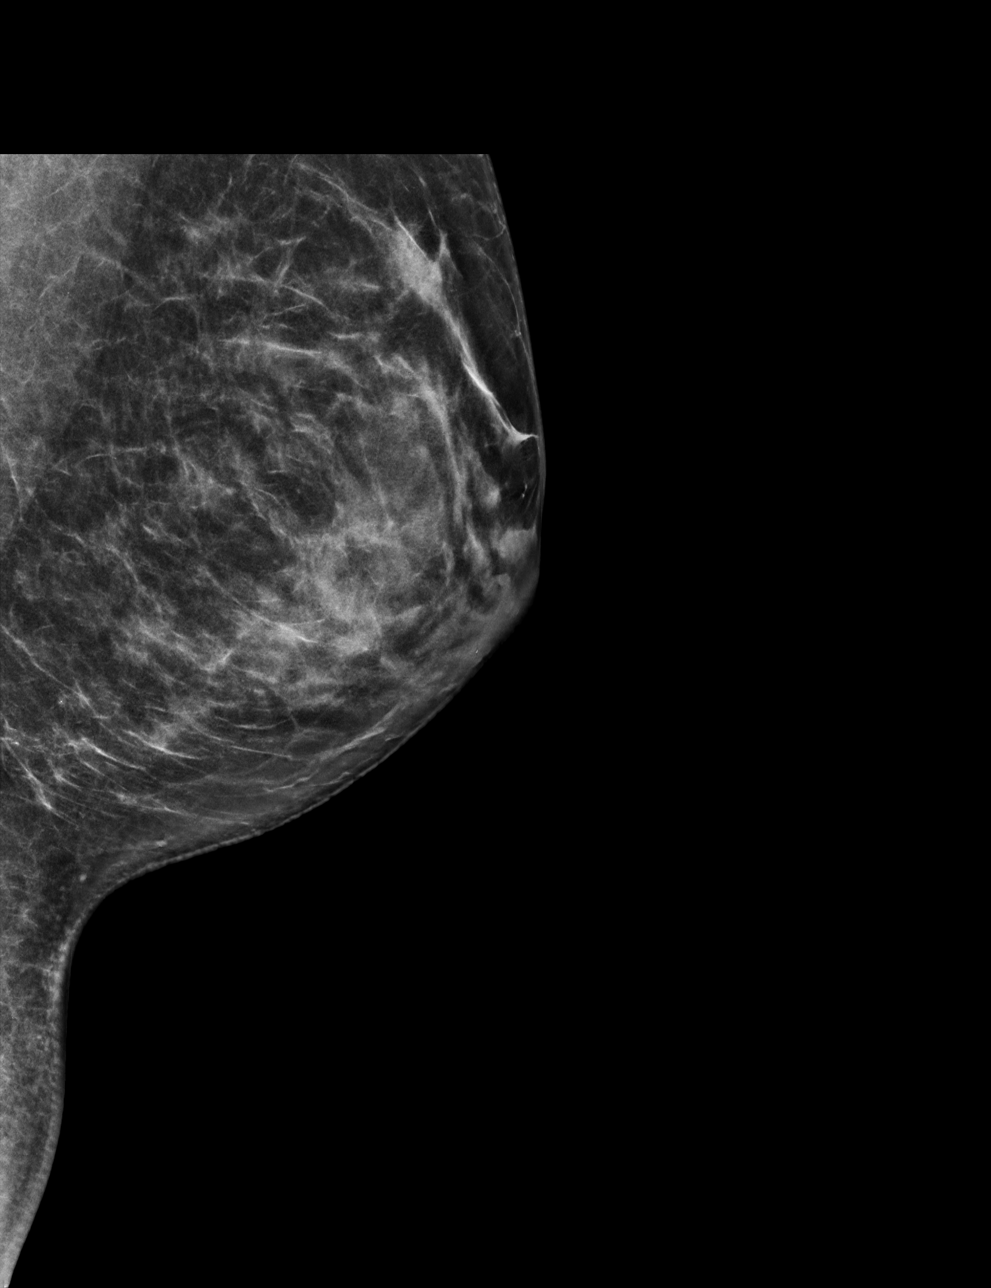

[L ML tomo · tomo slice 33/66.0]
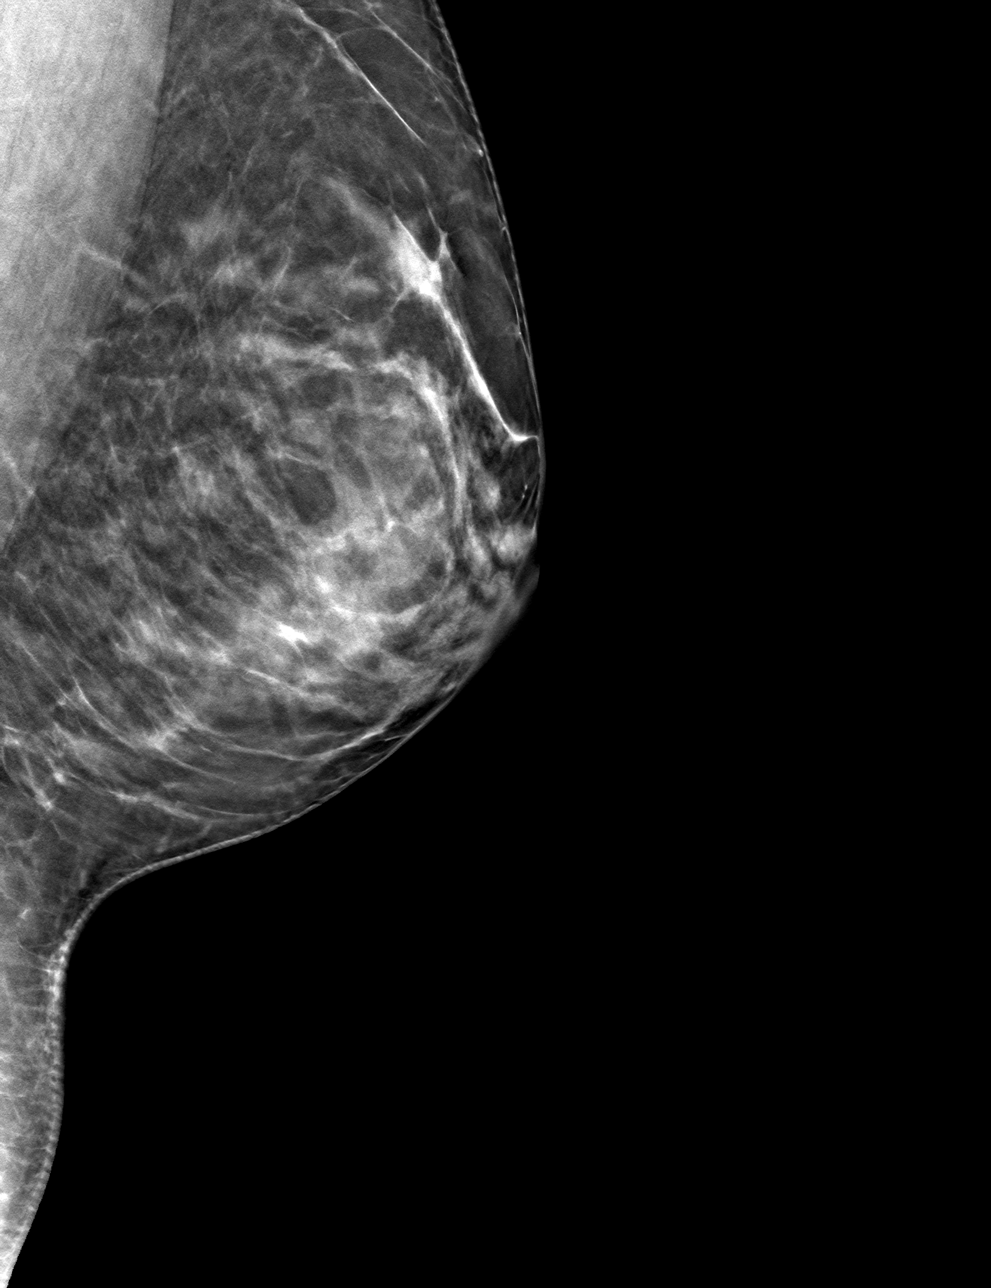

[L CC tomo · tomo slice 33/65.0]
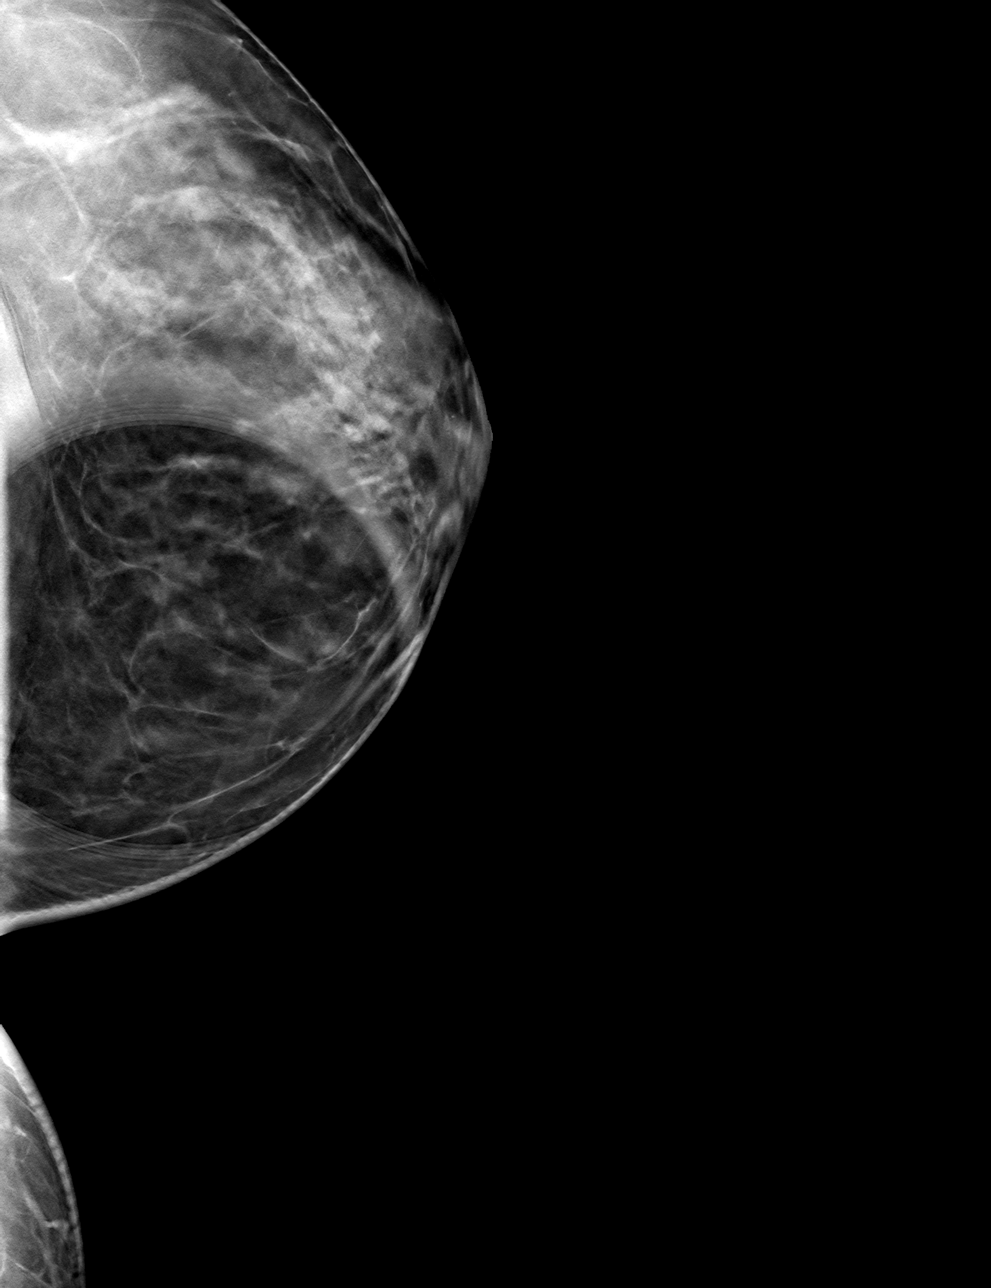

[4 of 12 positions shown; findings below may reference images not displayed]

ACR Breast Density Category c: The breast tissue is heterogeneously
dense, which may obscure small masses.
FINDINGS: The asymmetry in the medial left breast resolves on additional
imaging, consistent with an island of tissue.

Mammographic images were processed with CAD.
IMPRESSION: No mammographic evidence of malignancy on the left.

RECOMMENDATION:
Annual screening mammography.

I have discussed the findings and recommendations with the patient.
If applicable, a reminder letter will be sent to the patient
regarding the next appointment.

BI-RADS CATEGORY  1: Negative.

## 2021-09-19 ENCOUNTER — Ambulatory Visit (INDEPENDENT_AMBULATORY_CARE_PROVIDER_SITE_OTHER): Payer: 59

## 2021-09-19 DIAGNOSIS — T63441D Toxic effect of venom of bees, accidental (unintentional), subsequent encounter: Secondary | ICD-10-CM | POA: Diagnosis not present

## 2021-11-14 ENCOUNTER — Ambulatory Visit: Payer: 59

## 2021-11-19 ENCOUNTER — Ambulatory Visit (INDEPENDENT_AMBULATORY_CARE_PROVIDER_SITE_OTHER): Payer: 59

## 2021-11-19 ENCOUNTER — Other Ambulatory Visit: Payer: Self-pay | Admitting: Obstetrics and Gynecology

## 2021-11-19 DIAGNOSIS — T63441D Toxic effect of venom of bees, accidental (unintentional), subsequent encounter: Secondary | ICD-10-CM | POA: Diagnosis not present

## 2022-01-11 ENCOUNTER — Ambulatory Visit: Payer: 59

## 2022-01-15 ENCOUNTER — Ambulatory Visit (INDEPENDENT_AMBULATORY_CARE_PROVIDER_SITE_OTHER): Payer: 59

## 2022-01-15 DIAGNOSIS — T63441D Toxic effect of venom of bees, accidental (unintentional), subsequent encounter: Secondary | ICD-10-CM

## 2022-01-23 ENCOUNTER — Other Ambulatory Visit: Payer: Self-pay | Admitting: Obstetrics and Gynecology

## 2022-01-23 DIAGNOSIS — Z1231 Encounter for screening mammogram for malignant neoplasm of breast: Secondary | ICD-10-CM

## 2022-01-24 ENCOUNTER — Ambulatory Visit
Admission: RE | Admit: 2022-01-24 | Discharge: 2022-01-24 | Disposition: A | Discharge status: Home / Self Care | Payer: 59 | Source: Ambulatory Visit | Attending: Obstetrics and Gynecology | Admitting: Obstetrics and Gynecology

## 2022-01-24 DIAGNOSIS — Z1231 Encounter for screening mammogram for malignant neoplasm of breast: Secondary | ICD-10-CM

## 2022-03-12 ENCOUNTER — Ambulatory Visit: Payer: 59

## 2022-03-19 ENCOUNTER — Ambulatory Visit (INDEPENDENT_AMBULATORY_CARE_PROVIDER_SITE_OTHER): Payer: 59

## 2022-03-19 DIAGNOSIS — T63441D Toxic effect of venom of bees, accidental (unintentional), subsequent encounter: Secondary | ICD-10-CM

## 2022-05-14 ENCOUNTER — Ambulatory Visit (INDEPENDENT_AMBULATORY_CARE_PROVIDER_SITE_OTHER): Payer: 59

## 2022-05-14 DIAGNOSIS — T63441D Toxic effect of venom of bees, accidental (unintentional), subsequent encounter: Secondary | ICD-10-CM | POA: Diagnosis not present

## 2022-05-29 ENCOUNTER — Other Ambulatory Visit (HOSPITAL_BASED_OUTPATIENT_CLINIC_OR_DEPARTMENT_OTHER): Payer: Self-pay | Admitting: Family Medicine

## 2022-05-29 DIAGNOSIS — E785 Hyperlipidemia, unspecified: Secondary | ICD-10-CM

## 2022-06-04 ENCOUNTER — Ambulatory Visit (HOSPITAL_BASED_OUTPATIENT_CLINIC_OR_DEPARTMENT_OTHER)
Admission: RE | Admit: 2022-06-04 | Discharge: 2022-06-04 | Disposition: A | Discharge status: Home / Self Care | Payer: 59 | Source: Ambulatory Visit | Attending: Family Medicine | Admitting: Family Medicine

## 2022-06-04 DIAGNOSIS — E785 Hyperlipidemia, unspecified: Secondary | ICD-10-CM

## 2022-06-10 ENCOUNTER — Telehealth: Payer: Self-pay | Admitting: Family

## 2022-06-10 ENCOUNTER — Other Ambulatory Visit: Payer: Self-pay

## 2022-06-10 NOTE — Telephone Encounter (Signed)
Pharmacy is requesting a new Auvi-Q prescription.

## 2022-06-10 NOTE — Telephone Encounter (Signed)
Resent script to [facility] stating pt needs office visit, pt last seen 2022.

## 2022-06-11 ENCOUNTER — Other Ambulatory Visit: Payer: Self-pay

## 2022-06-11 MED ORDER — EPINEPHRINE 0.3 MG/0.3ML IJ SOAJ: 0.3000 mg | INTRAMUSCULAR | 2 refills | Status: DC | PRN

## 2022-06-13 ENCOUNTER — Other Ambulatory Visit: Payer: Self-pay | Admitting: Family Medicine

## 2022-06-13 DIAGNOSIS — K769 Liver disease, unspecified: Secondary | ICD-10-CM

## 2022-07-09 ENCOUNTER — Ambulatory Visit (INDEPENDENT_AMBULATORY_CARE_PROVIDER_SITE_OTHER): Payer: 59

## 2022-07-09 DIAGNOSIS — T63441D Toxic effect of venom of bees, accidental (unintentional), subsequent encounter: Secondary | ICD-10-CM

## 2022-07-19 ENCOUNTER — Ambulatory Visit
Admission: RE | Admit: 2022-07-19 | Discharge: 2022-07-19 | Disposition: A | Discharge status: Home / Self Care | Payer: 59 | Source: Ambulatory Visit | Attending: Family Medicine | Admitting: Family Medicine

## 2022-07-19 DIAGNOSIS — K769 Liver disease, unspecified: Secondary | ICD-10-CM

## 2022-09-03 ENCOUNTER — Ambulatory Visit: Payer: 59

## 2022-09-03 DIAGNOSIS — T63441D Toxic effect of venom of bees, accidental (unintentional), subsequent encounter: Secondary | ICD-10-CM | POA: Diagnosis not present

## 2022-10-30 ENCOUNTER — Telehealth: Payer: Self-pay

## 2022-10-30 NOTE — Telephone Encounter (Signed)
Patient calling stating she was just diagnosed with lyme's disease and currently on doxycycline. Is it still ok for her to receive her venom injection(s) tomorrow or do we need to reschedule?

## 2022-10-30 NOTE — Telephone Encounter (Signed)
Thanks Dr. Maurine Minister!

## 2022-10-30 NOTE — Telephone Encounter (Signed)
I do not know of any contraindications,but let me ask Dr. Maurine Minister to see if she knows of anything differently

## 2022-10-31 ENCOUNTER — Ambulatory Visit (INDEPENDENT_AMBULATORY_CARE_PROVIDER_SITE_OTHER): Payer: 59

## 2022-10-31 DIAGNOSIS — T63441D Toxic effect of venom of bees, accidental (unintentional), subsequent encounter: Secondary | ICD-10-CM

## 2022-10-31 NOTE — Telephone Encounter (Signed)
Patient already came in and received her venom injections this morning. Scheduled her a yearly appointment with Chrissie October 3rd at 9:30 am.

## 2022-11-27 ENCOUNTER — Ambulatory Visit (INDEPENDENT_AMBULATORY_CARE_PROVIDER_SITE_OTHER): Payer: 59 | Admitting: Internal Medicine

## 2022-11-27 ENCOUNTER — Other Ambulatory Visit: Payer: Self-pay

## 2022-11-27 ENCOUNTER — Encounter: Payer: Self-pay | Admitting: Internal Medicine

## 2022-11-27 VITALS — BP 112/73 | HR 73 | Resp 16 | Ht 61.0 in | Wt 151.1 lb

## 2022-11-27 DIAGNOSIS — W57XXXA Bitten or stung by nonvenomous insect and other nonvenomous arthropods, initial encounter: Secondary | ICD-10-CM

## 2022-11-27 DIAGNOSIS — S60461A Insect bite (nonvenomous) of left index finger, initial encounter: Secondary | ICD-10-CM | POA: Diagnosis not present

## 2022-11-27 NOTE — Patient Instructions (Signed)
You could have had viral illness or tick disease    We can check some of these tick disease properly and see if it is really the case.    The previous testing skipped a step so I am not sure how to interpret that   But eitherway you should be well treated/gotten over it already.  Testing results would be purely for knowing it basis

## 2022-11-27 NOTE — Patient Instructions (Incomplete)
Toxic effect of venom of bees Avoid stinging insects. In case of an allergic reaction, give Benadryl 4 teaspoonfuls every 4 hours, and if life-threatening symptoms occur, inject with AuviQ 0.3 mg. Refill sent Change venom injections to every 10 weeks and have access to your epinephrine auto injector device.  Schedule a follow up appointment in 12 months or sooner if needed

## 2022-11-27 NOTE — Progress Notes (Addendum)
[facility]  Reason for Consult:tick bite Referring Provider: [facility]    Patient Active Problem List   Diagnosis Date Noted   Recurrent deep vein thrombosis (DVT) (HCC) 04/05/2021   Palpitations 04/05/2021   Toxic effect of venom of bees, unintentional 07/06/2018   Allergic reaction to insect sting 01/02/2016      HPI: Julie Navarro is a 57 y.o. female referred here by pcp for tick bite illness  Chart reviewed pcp's note from 10/22/22 visit   From patient: Patient was in [place] when this happened She goes to [place], [place] every weekend remodeling a lake home (lots of trees/deer/vegetation) Patient was at baseline health until 8/12, then started feeling nauseous/bloated, associated with bilateral lower back pain. She developed also within 24 hours 102 fever that night.  2 days after sx onset was seen at [facility] urgent care. Worked up for uti and given an empiric abx before test resulted and take something twice a day (but she only took 1 pill before testing came back not suggestive uti). That day diarrhea started too (nbnb) but did take some laxative for initial constipation 8/15 got on an air plane to go to [place] [employer] [place] for vacation. Patient had worsened nausea/gi sx including pretty constant watery diarrhea.  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 All  this time no rash, short of breath, cough. Saw a nurse practitioner at [facility] and lyme testing was done 8/30 given doxy for positive lyme blot test.   For the 3 weeks when she had doxy nothing seems to improve in terms of nausea, headache, dizziness.     9/11 flew to [place], [place]   She noticed a bump that looked blacked on left index finger but doesn't remember exactly when, but this was could have been just before [place] flight   She does reports lots of ticks seen in [place], [place]. I reviewed the picture/bag she had of the dead tick and it appears to be an ixodes species tick that is in the nymph stage   Patient started feeling fine almost back to normal baseline only this past week (3rd week of September). Just some nausea mild now and then and still a little neck pain.    Again, the labs checked on 8/26 by pcp: Lft --> 41/37/44/0.3; cbc 4.4/13.6/233 H pylori stool negative; stool culture negative; cdiff toxin by pcr negative Western blot lyme positive      Review of Systems: ROS All other ros negative       Past Medical History:  Diagnosis Date   Bee allergy status    Foot fracture 06/22/2018   LEFT    Heart palpitations    History of DVT (deep vein thrombosis)    Sensorineural hearing loss    Vitamin D deficiency     Social History   Tobacco Use   Smoking status: Never   Smokeless tobacco: Never  Substance Use Topics  Alcohol use: No   Drug use: No    No family history on file.  Allergies  Allergen Reactions   Bee Venom     OBJECTIVE: Vitals:   11/27/22 1423  BP: 112/73  Pulse: 73  Resp: 16  Weight: 151 lb 1.6 oz (68.5 kg)  Height: 5\' 1"  (1.549 m)   There is no height or weight on file to calculate BMI.   Physical Exam General/constitutional: no distress, pleasant HEENT: Normocephalic, PER, Conj Clear, EOMI, Oropharynx clear Neck supple CV: rrr no mrg Lungs: clear to auscultation, normal respiratory effort Abd: Soft, Nontender Ext:  no edema Skin: No Rash Neuro: nonfocal MSK: no peripheral joint swelling/tenderness/warmth; back spines nontender   Lab: See hpi   Microbiology:  Serology:  Imaging:   Assessment/plan: Problem List Items Addressed This Visit   None Visit Diagnoses     Tick bite, unspecified site, initial encounter    -  Primary         She has a febrile no-rash diarrhea illness in a low endemic lyme area. This all occurred before travel to [place].   Sx could be tick related or other viral illnesses  She has a western blot test only instead of the 2 tier lyme testing from her pcp on 8/26   Discussed lyme manifestation/complication and other spotted fever manifestation. All these are already treated with doxy in more than sufficient duration   We can do a full proper lyme testing along with other spotted fever serology, and see if it is truly lyme if igm is still around. I discussed that the spotted fever serology might be difficult to interpret given we don't have acute phase testing.   She is agreeable to testing.   At this time, she had mostly recovered.         Follow-up: No follow-ups on file.  Raymondo Band, MD [facility] 11/27/2022, 2:13 PM

## 2022-11-28 ENCOUNTER — Ambulatory Visit: Payer: 59 | Admitting: Family

## 2022-11-28 ENCOUNTER — Encounter: Payer: Self-pay | Admitting: Family

## 2022-11-28 VITALS — BP 108/64 | HR 72 | Temp 97.9°F | Resp 17 | Wt 151.2 lb

## 2022-11-28 DIAGNOSIS — T63441D Toxic effect of venom of bees, accidental (unintentional), subsequent encounter: Secondary | ICD-10-CM | POA: Diagnosis not present

## 2022-11-28 MED ORDER — EPINEPHRINE 0.3 MG/0.3ML IJ SOAJ: 0.3000 mg | INTRAMUSCULAR | 1 refills | Status: AC | PRN

## 2022-11-28 NOTE — Progress Notes (Signed)
[address] [place] [address] Dept: [phone]  FOLLOW UP NOTE  Patient ID: Julie Navarro, female    DOB: 15-Nov-1965  Age: 57 y.o. MRN: [id number] Date of Office Visit: 11/28/2022  Assessment  Chief Complaint: Follow-up  HPI Julie Navarro is a 57 year old female presents today for follow-up of toxic effect of venom of bees.  She was last seen by myself on October 27, 2020.  She reports since her last office visit she was diagnosed with Lyme disease by her primary care physician and given doxycycline.  She saw infectious disease yesterday for follow-up.  She feels like most of her symptoms are gone now.  Since her last office visit she reports that she has not been stung by any bees and has not had to use her epinephrine autoinjector device.  She continues to receive her venom injections every 8 weeks.  She reports that possibly the mixed vespid injection will get itchy and sometimes red.  By the end of the day she does not notice it.  Her first initial reaction was in 1979 and she reports that it involved anaphylaxis.   Drug Allergies:  Allergies  Allergen Reactions   Bee Venom     Review of Systems: Negative except as per HPI   Physical Exam: BP 108/64   Pulse 72   Temp 97.9 F (36.6 C) (Temporal)   Resp 17   Wt 151 lb 3.2 oz (68.6 kg)   SpO2 98%   BMI 28.57 kg/m    Physical Exam Constitutional:      Appearance: Normal appearance.  HENT:     Head: Normocephalic and atraumatic.     Comments: Pharynx normal, eyes normal, ears normal, nose normal    Right Ear: Tympanic membrane, ear canal and external ear normal.     Left Ear: Tympanic membrane, ear canal and external ear normal.     Nose: Nose normal.     Mouth/Throat:     Mouth: Mucous membranes are moist.     Pharynx: Oropharynx is clear.  Eyes:     Conjunctiva/sclera: Conjunctivae normal.  Cardiovascular:     Rate and Rhythm: Regular rhythm.     Heart sounds: Normal heart sounds.   Pulmonary:     Effort: Pulmonary effort is normal.     Breath sounds: Normal breath sounds.     Comments: Lungs clear to auscultation Musculoskeletal:     Cervical back: Neck supple.  Skin:    General: Skin is warm.  Neurological:     Mental Status: She is alert and oriented to person, place, and time.  Psychiatric:        Mood and Affect: Mood normal.        Behavior: Behavior normal.        Thought Content: Thought content normal.        Judgment: Judgment normal.     Diagnostics:  None  Assessment and Plan: 1. Toxic effect of venom of bees, unintentional, subsequent encounter     Meds ordered this encounter  Medications   EPINEPHrine (AUVI-Q) 0.3 mg/0.3 mL IJ SOAJ injection    Sig: Inject 0.3 mg into the muscle as needed for anaphylaxis.    Dispense:  2 each    Refill:  1    [phone]    Patient Instructions  Toxic effect of venom of bees Avoid stinging insects. In case of an allergic reaction, give Benadryl 4 teaspoonfuls every 4 hours, and if life-threatening symptoms occur,  inject with AuviQ 0.3 mg. Refill sent Change venom injections to every 10 weeks and have access to your epinephrine auto injector device.  Schedule a follow up appointment in 12 months or sooner if needed    Return in about 1 year (around 11/28/2023), or if symptoms worsen or fail to improve.    Thank you for the opportunity to care for this patient.  Please do not hesitate to contact me with questions.  Nehemiah Settle, FNP [facility]

## 2022-12-03 LAB — CBC WITH DIFFERENTIAL/PLATELET
Absolute Monocytes: 382 {cells}/uL (ref 200–950)
Basophils Absolute: 59 {cells}/uL (ref 0–200)
Basophils Relative: 1.2 %
Eosinophils Absolute: 39 {cells}/uL (ref 15–500)
Eosinophils Relative: 0.8 %
HCT: 44.8 % (ref 35.0–45.0)
Hemoglobin: 14.3 g/dL (ref 11.7–15.5)
Lymphs Abs: 1220 {cells}/uL (ref 850–3900)
MCH: 29.2 pg (ref 27.0–33.0)
MCHC: 31.9 g/dL — ABNORMAL LOW (ref 32.0–36.0)
MCV: 91.6 fL (ref 80.0–100.0)
MPV: 9.9 fL (ref 7.5–12.5)
Monocytes Relative: 7.8 %
Neutro Abs: 3200 {cells}/uL (ref 1500–7800)
Neutrophils Relative %: 65.3 %
Platelets: 237 10*3/uL (ref 140–400)
RBC: 4.89 10*6/uL (ref 3.80–5.10)
RDW: 12.6 % (ref 11.0–15.0)
Total Lymphocyte: 24.9 %
WBC: 4.9 10*3/uL (ref 3.8–10.8)

## 2022-12-03 LAB — COMPLETE METABOLIC PANEL WITH GFR
AG Ratio: 1.7 (calc) (ref 1.0–2.5)
ALT: 10 U/L (ref 6–29)
AST: 16 U/L (ref 10–35)
Albumin: 4.7 g/dL (ref 3.6–5.1)
Alkaline phosphatase (APISO): 49 U/L (ref 37–153)
BUN: 15 mg/dL (ref 7–25)
CO2: 27 mmol/L (ref 20–32)
Calcium: 9.8 mg/dL (ref 8.6–10.4)
Chloride: 104 mmol/L (ref 98–110)
Creat: 0.93 mg/dL (ref 0.50–1.03)
Globulin: 2.7 g/dL (ref 1.9–3.7)
Glucose, Bld: 90 mg/dL (ref 65–99)
Potassium: 4.1 mmol/L (ref 3.5–5.3)
Sodium: 141 mmol/L (ref 135–146)
Total Bilirubin: 0.4 mg/dL (ref 0.2–1.2)
Total Protein: 7.4 g/dL (ref 6.1–8.1)
eGFR: 73 mL/min/{1.73_m2} (ref >=60)

## 2022-12-03 LAB — EHRLICHIA ANTIBODY PANEL
E. CHAFFEENSIS AB IGG: <1:64 {titer}
E. CHAFFEENSIS AB IGM: <1:20 {titer}

## 2022-12-03 LAB — B. BURGDORFI ANTIBODIES: B burgdorferi Ab IgG+IgM: <0.9 {index}

## 2022-12-03 LAB — ROCKY MTN SPOTTED FVR ABS PNL(IGG+IGM)
RMSF IgG: NOT DETECTED
RMSF IgM: NOT DETECTED

## 2022-12-26 ENCOUNTER — Ambulatory Visit: Payer: 59

## 2023-01-09 ENCOUNTER — Ambulatory Visit: Payer: 59

## 2023-01-14 ENCOUNTER — Ambulatory Visit (INDEPENDENT_AMBULATORY_CARE_PROVIDER_SITE_OTHER): Payer: 59

## 2023-01-14 DIAGNOSIS — T63441D Toxic effect of venom of bees, accidental (unintentional), subsequent encounter: Secondary | ICD-10-CM

## 2023-01-28 ENCOUNTER — Other Ambulatory Visit: Payer: Self-pay | Admitting: Family Medicine

## 2023-01-28 DIAGNOSIS — K769 Liver disease, unspecified: Secondary | ICD-10-CM

## 2023-02-10 ENCOUNTER — Ambulatory Visit (HOSPITAL_BASED_OUTPATIENT_CLINIC_OR_DEPARTMENT_OTHER)
Admission: RE | Admit: 2023-02-10 | Discharge: 2023-02-10 | Disposition: A | Discharge status: Home / Self Care | Payer: 59 | Source: Ambulatory Visit | Attending: Family Medicine | Admitting: Family Medicine

## 2023-02-10 ENCOUNTER — Other Ambulatory Visit (HOSPITAL_COMMUNITY): Payer: Self-pay | Admitting: Family Medicine

## 2023-02-10 DIAGNOSIS — D6869 Other thrombophilia: Secondary | ICD-10-CM | POA: Insufficient documentation

## 2023-02-10 DIAGNOSIS — R519 Headache, unspecified: Secondary | ICD-10-CM | POA: Insufficient documentation

## 2023-02-11 ENCOUNTER — Other Ambulatory Visit: Payer: 59

## 2023-02-11 ENCOUNTER — Ambulatory Visit
Admission: RE | Admit: 2023-02-11 | Discharge: 2023-02-11 | Disposition: A | Discharge status: Home / Self Care | Payer: 59 | Source: Ambulatory Visit | Attending: Family Medicine | Admitting: Family Medicine

## 2023-02-11 DIAGNOSIS — K769 Liver disease, unspecified: Secondary | ICD-10-CM

## 2023-03-12 ENCOUNTER — Other Ambulatory Visit: Payer: Self-pay | Admitting: Obstetrics and Gynecology

## 2023-03-12 DIAGNOSIS — Z1231 Encounter for screening mammogram for malignant neoplasm of breast: Secondary | ICD-10-CM

## 2023-03-25 ENCOUNTER — Ambulatory Visit (INDEPENDENT_AMBULATORY_CARE_PROVIDER_SITE_OTHER): Payer: 59

## 2023-03-25 DIAGNOSIS — T63441D Toxic effect of venom of bees, accidental (unintentional), subsequent encounter: Secondary | ICD-10-CM

## 2023-03-26 ENCOUNTER — Ambulatory Visit
Admission: RE | Admit: 2023-03-26 | Discharge: 2023-03-26 | Disposition: A | Discharge status: Home / Self Care | Payer: 59 | Source: Ambulatory Visit | Attending: Obstetrics and Gynecology | Admitting: Obstetrics and Gynecology

## 2023-03-26 DIAGNOSIS — Z1231 Encounter for screening mammogram for malignant neoplasm of breast: Secondary | ICD-10-CM

## 2023-06-03 ENCOUNTER — Ambulatory Visit: Payer: 59

## 2023-06-05 ENCOUNTER — Ambulatory Visit (INDEPENDENT_AMBULATORY_CARE_PROVIDER_SITE_OTHER)

## 2023-06-05 DIAGNOSIS — T63441D Toxic effect of venom of bees, accidental (unintentional), subsequent encounter: Secondary | ICD-10-CM

## 2023-07-25 ENCOUNTER — Telehealth: Payer: Self-pay | Admitting: Gastroenterology

## 2023-07-25 NOTE — Telephone Encounter (Signed)
 Patient called and stated that she was calling back from a referral that was sent over to us . Patient is wanting to scheduled a colonoscopy with Dr. Venice Gillis. Patient had a colonscopy with Dr.Medoff with [facility]. Patient is going to request that they fax over those records to us . Please advise.

## 2023-08-14 ENCOUNTER — Ambulatory Visit (INDEPENDENT_AMBULATORY_CARE_PROVIDER_SITE_OTHER)

## 2023-08-14 DIAGNOSIS — T63441D Toxic effect of venom of bees, accidental (unintentional), subsequent encounter: Secondary | ICD-10-CM | POA: Diagnosis not present

## 2023-10-23 ENCOUNTER — Ambulatory Visit (INDEPENDENT_AMBULATORY_CARE_PROVIDER_SITE_OTHER)

## 2023-10-23 DIAGNOSIS — T63441D Toxic effect of venom of bees, accidental (unintentional), subsequent encounter: Secondary | ICD-10-CM

## 2023-11-17 ENCOUNTER — Ambulatory Visit (HOSPITAL_BASED_OUTPATIENT_CLINIC_OR_DEPARTMENT_OTHER)

## 2023-11-17 ENCOUNTER — Ambulatory Visit (INDEPENDENT_AMBULATORY_CARE_PROVIDER_SITE_OTHER): Admitting: Student

## 2023-11-17 DIAGNOSIS — M25562 Pain in left knee: Secondary | ICD-10-CM

## 2023-11-17 DIAGNOSIS — M2242 Chondromalacia patellae, left knee: Secondary | ICD-10-CM

## 2023-11-17 MED ORDER — LIDOCAINE HCL 1 % IJ SOLN
4.0000 mL | INTRAMUSCULAR | Status: AC | PRN
  Administered 2023-11-17: 4 mL

## 2023-11-17 MED ORDER — TRIAMCINOLONE ACETONIDE 40 MG/ML IJ SUSP
2.0000 mL | INTRAMUSCULAR | Status: AC | PRN
  Administered 2023-11-17: 2 mL via INTRA_ARTICULAR

## 2023-11-17 NOTE — Progress Notes (Signed)
 Chief Complaint: Left knee pain    Discussed the use of AI scribe software for clinical note transcription with the patient, who gave verbal consent to proceed.  History of Present Illness Julie Navarro is a 58 year old female with knee cartilage damage who presents with right knee pain and swelling. She experiences right knee pain and swelling that began after a six-mile hike over two days, which exceeded her usual activity level. The knee feels tight and swollen upon waking, with 'snap, crackle, pop' sensations and grinding noises during hill descents. Persistent swelling and pain have followed. She uses a knee brace and ice for symptom management. Her history includes cartilage damage in the right knee, previously managed with cortisone shots. A right knee MRI in 2012 indicated cartilage damage. She cannot take anti-inflammatory medications due to blood thinners. Lunges exacerbate her knee pain, and she experiences cracking and wobbling sensations.   Surgical History:   None  PMH/PSH/Family History/Social History/Meds/Allergies:    Past Medical History:  Diagnosis Date   Bee allergy status    Foot fracture 06/22/2018   LEFT    Heart palpitations    History of DVT (deep vein thrombosis)    Sensorineural hearing loss    Vitamin D deficiency    Past Surgical History:  Procedure Laterality Date   TUBAL LIGATION     Social History   Socioeconomic History   Marital status: Single    Spouse name: Not on file   Number of children: Not on file   Years of education: Not on file   Highest education level: Not on file  Occupational History   Not on file  Tobacco Use   Smoking status: Never    Passive exposure: Never   Smokeless tobacco: Never  Vaping Use   Vaping status: Never Used  Substance and Sexual Activity   Alcohol use: No   Drug use: No   Sexual activity: Yes  Other Topics Concern   Not on file  Social History Narrative   Not  on file   Social Drivers of Health   Financial Resource Strain: Not on file  Food Insecurity: Not on file  Transportation Needs: Not on file  Physical Activity: Not on file  Stress: Not on file  Social Connections: Not on file   No family history on file. Allergies  Allergen Reactions   Bee Venom    Current Outpatient Medications  Medication Sig Dispense Refill   apixaban (ELIQUIS) 5 MG TABS tablet Take 5 mg by mouth 2 (two) times daily.     cephALEXin (KEFLEX) 500 MG capsule Take 500 mg by mouth 4 (four) times daily. (Patient not taking: Reported on 11/27/2022)     cyclobenzaprine (FLEXERIL) 10 MG tablet Take 10 mg by mouth daily. 1/2 tablet- 1 tablet  once daily     doxycycline (VIBRA-TABS) 100 MG tablet Take 100 mg by mouth 2 (two) times daily. (Patient not taking: Reported on 11/27/2022)     EPINEPHrine  (AUVI-Q ) 0.3 mg/0.3 mL IJ SOAJ injection Inject 0.3 mg into the muscle as needed for anaphylaxis. 2 each 1   estradiol (VIVELLE-DOT) 0.0375 MG/24HR Place 1 patch onto the skin 2 (two) times a week.     NONFORMULARY OR COMPOUNDED ITEM Venom injections     ondansetron (ZOFRAN) 8 MG tablet Take  8 mg by mouth once.     progesterone (PROMETRIUM) 100 MG capsule Take 100 mg by mouth at bedtime.     Vitamin D, Ergocalciferol, (DRISDOL) 1.25 MG (50000 UNIT) CAPS capsule Take 50,000 Units by mouth every 7 (seven) days.     No current facility-administered medications for this visit.   No results found.  Review of Systems:   A ROS was performed including pertinent positives and negatives as documented in the HPI.  Physical Exam :   Constitutional: NAD and appears stated age Neurological: Alert and oriented Psych: Appropriate affect and cooperative There were no vitals taken for this visit.   Comprehensive Musculoskeletal Exam:    Left knee exam demonstrates presence of mild to moderate effusion without overlying erythema or warmth.  No significant medial or lateral joint line  tenderness.  Stable collaterals with varus and valgus stress.  There is a Baker's cyst palpable in the posterior knee.  Active range of motion is from 0 to 120 degrees.  Imaging:   Xray (left knee 4 view): Very mild tricompartmental degenerative changes but otherwise negative for bony abnormality.   I personally reviewed and interpreted the radiographs.      Assessment & Plan Acute left knee pain Patient is experiencing acute left knee pain after recent increase in physical activity but no history of injury.  X-rays today show very mild degenerative changes.  She does have history of chondromalacia in the patellofemoral compartment on the contralateral knee that has been treated with injections and knee strengthening.  I believe that the left knee is likely following a similar pattern although not as severe.  She does have an effusion present on exam today which was visualized under ultrasound.  Discussed administering a cortisone injection for symptomatic relief as well as an aspiration for fluid removal.  Patient is agreeable and I was able to aspirate 35 mL of clear synovial fluid followed by cortisone which she tolerated well.  HEP given for knee strengthening exercises.  Will plan to have her follow-up as needed.     Procedure Note  Patient: Julie Navarro             Date of Birth: Aug 30, 1965           MRN: [id number]             Visit Date: 11/17/2023  Procedures: Visit Diagnoses:  1. Acute pain of left knee     Large Joint Inj: L knee on 11/17/2023 5:18 PM Indications: pain and joint swelling Details: 18 G 1.5 in needle, superolateral approach Medications: 4 mL lidocaine  1 %; 2 mL triamcinolone  acetonide 40 MG/ML Aspirate: 35 mL clear Outcome: tolerated well, no immediate complications Procedure, treatment alternatives, risks and benefits explained, specific risks discussed. Consent was given by the patient. Immediately prior to procedure a time out was called to verify  the correct patient, procedure, equipment, support staff and site/side marked as required. Patient was prepped and draped in the usual sterile fashion.       I personally saw and evaluated the patient, and participated in the management and treatment plan.  Leonce Reveal, PA-C Orthopedics

## 2023-12-29 ENCOUNTER — Encounter: Payer: Self-pay | Admitting: Radiology

## 2024-01-01 ENCOUNTER — Ambulatory Visit (INDEPENDENT_AMBULATORY_CARE_PROVIDER_SITE_OTHER)

## 2024-01-01 DIAGNOSIS — T63441D Toxic effect of venom of bees, accidental (unintentional), subsequent encounter: Secondary | ICD-10-CM

## 2024-03-05 ENCOUNTER — Other Ambulatory Visit: Payer: Self-pay | Admitting: Obstetrics and Gynecology

## 2024-03-05 DIAGNOSIS — Z1231 Encounter for screening mammogram for malignant neoplasm of breast: Secondary | ICD-10-CM

## 2024-03-11 ENCOUNTER — Ambulatory Visit

## 2024-03-11 DIAGNOSIS — T63441D Toxic effect of venom of bees, accidental (unintentional), subsequent encounter: Secondary | ICD-10-CM

## 2024-03-22 ENCOUNTER — Other Ambulatory Visit: Payer: Self-pay | Admitting: Medical Genetics

## 2024-03-26 ENCOUNTER — Ambulatory Visit
Admission: RE | Admit: 2024-03-26 | Discharge: 2024-03-26 | Disposition: A | Source: Ambulatory Visit | Attending: Obstetrics and Gynecology | Admitting: Obstetrics and Gynecology

## 2024-03-26 DIAGNOSIS — Z1231 Encounter for screening mammogram for malignant neoplasm of breast: Secondary | ICD-10-CM

## 2024-05-18 ENCOUNTER — Ambulatory Visit

## 2024-05-20 ENCOUNTER — Ambulatory Visit
